# Patient Record
Sex: Male | Born: 1960
Health system: Southern US, Community
[De-identification: ages and names within clinical notes are randomized; demographics above are authoritative.]

## PROBLEM LIST (undated history)

## (undated) DIAGNOSIS — E785 Hyperlipidemia, unspecified: Secondary | ICD-10-CM

## (undated) DIAGNOSIS — D72820 Lymphocytosis (symptomatic): Secondary | ICD-10-CM

## (undated) DIAGNOSIS — F79 Unspecified intellectual disabilities: Secondary | ICD-10-CM

## (undated) DIAGNOSIS — I10 Essential (primary) hypertension: Secondary | ICD-10-CM

## (undated) DIAGNOSIS — N419 Inflammatory disease of prostate, unspecified: Secondary | ICD-10-CM

## (undated) HISTORY — PX: CHOLECYSTECTOMY: SHX55

## (undated) HISTORY — DX: Essential (primary) hypertension: I10

## (undated) HISTORY — DX: Lymphocytosis (symptomatic): D72.820

## (undated) HISTORY — DX: Unspecified intellectual disabilities: F79

## (undated) HISTORY — DX: Other disorders of bilirubin metabolism: E80.6

## (undated) HISTORY — DX: Hyperlipidemia, unspecified: E78.5

## (undated) HISTORY — DX: Inflammatory disease of prostate, unspecified: N41.9

---

## 2009-10-02 ENCOUNTER — Ambulatory Visit: Payer: Self-pay | Admitting: Gastroenterology

## 2009-10-02 LAB — HM COLONOSCOPY: HM COLON: NORMAL

## 2011-05-22 DIAGNOSIS — B351 Tinea unguium: Secondary | ICD-10-CM | POA: Diagnosis not present

## 2011-05-22 DIAGNOSIS — L600 Ingrowing nail: Secondary | ICD-10-CM | POA: Diagnosis not present

## 2011-05-22 DIAGNOSIS — M79609 Pain in unspecified limb: Secondary | ICD-10-CM | POA: Diagnosis not present

## 2011-08-31 DIAGNOSIS — I10 Essential (primary) hypertension: Secondary | ICD-10-CM | POA: Diagnosis not present

## 2012-02-10 DIAGNOSIS — Z23 Encounter for immunization: Secondary | ICD-10-CM | POA: Diagnosis not present

## 2012-08-09 DIAGNOSIS — H6120 Impacted cerumen, unspecified ear: Secondary | ICD-10-CM | POA: Diagnosis not present

## 2012-08-09 DIAGNOSIS — I10 Essential (primary) hypertension: Secondary | ICD-10-CM | POA: Diagnosis not present

## 2013-03-24 DIAGNOSIS — Z23 Encounter for immunization: Secondary | ICD-10-CM | POA: Diagnosis not present

## 2013-03-24 DIAGNOSIS — R059 Cough, unspecified: Secondary | ICD-10-CM | POA: Diagnosis not present

## 2013-03-24 DIAGNOSIS — R05 Cough: Secondary | ICD-10-CM | POA: Diagnosis not present

## 2013-04-27 DIAGNOSIS — Z0000 Encounter for general adult medical examination without abnormal findings: Secondary | ICD-10-CM | POA: Diagnosis not present

## 2013-05-01 DIAGNOSIS — Z0000 Encounter for general adult medical examination without abnormal findings: Secondary | ICD-10-CM | POA: Diagnosis not present

## 2013-05-01 DIAGNOSIS — Z125 Encounter for screening for malignant neoplasm of prostate: Secondary | ICD-10-CM | POA: Diagnosis not present

## 2013-05-01 DIAGNOSIS — F79 Unspecified intellectual disabilities: Secondary | ICD-10-CM | POA: Diagnosis not present

## 2013-05-01 DIAGNOSIS — I10 Essential (primary) hypertension: Secondary | ICD-10-CM | POA: Diagnosis not present

## 2013-05-01 DIAGNOSIS — E785 Hyperlipidemia, unspecified: Secondary | ICD-10-CM | POA: Diagnosis not present

## 2013-06-16 DIAGNOSIS — D72820 Lymphocytosis (symptomatic): Secondary | ICD-10-CM | POA: Diagnosis not present

## 2013-06-20 DIAGNOSIS — D72820 Lymphocytosis (symptomatic): Secondary | ICD-10-CM | POA: Diagnosis not present

## 2013-06-23 DIAGNOSIS — J180 Bronchopneumonia, unspecified organism: Secondary | ICD-10-CM | POA: Diagnosis not present

## 2013-06-23 DIAGNOSIS — R059 Cough, unspecified: Secondary | ICD-10-CM | POA: Diagnosis not present

## 2013-10-27 DIAGNOSIS — R17 Unspecified jaundice: Secondary | ICD-10-CM | POA: Diagnosis not present

## 2013-10-27 DIAGNOSIS — E785 Hyperlipidemia, unspecified: Secondary | ICD-10-CM | POA: Diagnosis not present

## 2013-10-27 DIAGNOSIS — D72820 Lymphocytosis (symptomatic): Secondary | ICD-10-CM | POA: Diagnosis not present

## 2013-10-27 DIAGNOSIS — I10 Essential (primary) hypertension: Secondary | ICD-10-CM | POA: Diagnosis not present

## 2013-12-27 DIAGNOSIS — L578 Other skin changes due to chronic exposure to nonionizing radiation: Secondary | ICD-10-CM | POA: Diagnosis not present

## 2013-12-27 DIAGNOSIS — L408 Other psoriasis: Secondary | ICD-10-CM | POA: Diagnosis not present

## 2013-12-27 DIAGNOSIS — L570 Actinic keratosis: Secondary | ICD-10-CM | POA: Diagnosis not present

## 2014-05-22 DIAGNOSIS — E780 Pure hypercholesterolemia: Secondary | ICD-10-CM | POA: Diagnosis not present

## 2014-05-22 DIAGNOSIS — I10 Essential (primary) hypertension: Secondary | ICD-10-CM | POA: Diagnosis not present

## 2014-07-25 DIAGNOSIS — L400 Psoriasis vulgaris: Secondary | ICD-10-CM | POA: Diagnosis not present

## 2014-07-25 DIAGNOSIS — L578 Other skin changes due to chronic exposure to nonionizing radiation: Secondary | ICD-10-CM | POA: Diagnosis not present

## 2014-07-25 DIAGNOSIS — L570 Actinic keratosis: Secondary | ICD-10-CM | POA: Diagnosis not present

## 2015-01-17 DIAGNOSIS — L400 Psoriasis vulgaris: Secondary | ICD-10-CM | POA: Diagnosis not present

## 2015-01-17 DIAGNOSIS — L578 Other skin changes due to chronic exposure to nonionizing radiation: Secondary | ICD-10-CM | POA: Diagnosis not present

## 2015-01-17 DIAGNOSIS — L570 Actinic keratosis: Secondary | ICD-10-CM | POA: Diagnosis not present

## 2015-04-25 DIAGNOSIS — Z23 Encounter for immunization: Secondary | ICD-10-CM | POA: Diagnosis not present

## 2015-07-25 ENCOUNTER — Telehealth: Payer: Self-pay | Admitting: Family Medicine

## 2015-07-25 DIAGNOSIS — E785 Hyperlipidemia, unspecified: Secondary | ICD-10-CM | POA: Insufficient documentation

## 2015-07-25 DIAGNOSIS — D72820 Lymphocytosis (symptomatic): Secondary | ICD-10-CM | POA: Insufficient documentation

## 2015-07-25 DIAGNOSIS — N419 Inflammatory disease of prostate, unspecified: Secondary | ICD-10-CM | POA: Insufficient documentation

## 2015-07-25 DIAGNOSIS — I10 Essential (primary) hypertension: Secondary | ICD-10-CM | POA: Insufficient documentation

## 2015-07-25 DIAGNOSIS — F79 Unspecified intellectual disabilities: Secondary | ICD-10-CM | POA: Insufficient documentation

## 2015-07-25 MED ORDER — LOSARTAN POTASSIUM-HCTZ 50-12.5 MG PO TABS: 1.0000 | ORAL_TABLET | Freq: Every day | ORAL | Status: DC

## 2015-07-25 NOTE — Telephone Encounter (Signed)
°  we have a pt up front that is a Lada pt last visit was 05/2014 but needs a refill on BP medication, is not following [facility] She[facility],  if I schedule him with Dr. Laural BenesJohnson would she send an RX for pt's BP medication. Pt is completely out of this medication.  Pt needs Losartan. [facility] [address]. Please call pt's brother when this is completed.  Pt scheduled with Dr. Laural BenesJohnson 07/31/15. Thanks.

## 2015-07-25 NOTE — Telephone Encounter (Signed)
Rx from last visit in practice partner sent to his pharmacy

## 2015-07-31 ENCOUNTER — Ambulatory Visit (INDEPENDENT_AMBULATORY_CARE_PROVIDER_SITE_OTHER): Payer: Medicare Other | Admitting: Family Medicine

## 2015-07-31 ENCOUNTER — Encounter: Payer: Self-pay | Admitting: Family Medicine

## 2015-07-31 VITALS — BP 119/74 | HR 69 | Temp 98.0°F | Ht 67.6 in | Wt 177.0 lb

## 2015-07-31 DIAGNOSIS — E785 Hyperlipidemia, unspecified: Secondary | ICD-10-CM

## 2015-07-31 DIAGNOSIS — I10 Essential (primary) hypertension: Secondary | ICD-10-CM

## 2015-07-31 DIAGNOSIS — D72820 Lymphocytosis (symptomatic): Secondary | ICD-10-CM

## 2015-07-31 LAB — MICROALBUMIN, URINE WAIVED
Creatinine, Urine Waived: 100 mg/dL (ref 10–300)
Microalb, Ur Waived: 30 mg/L — ABNORMAL HIGH (ref 0–19)

## 2015-07-31 MED ORDER — LOSARTAN POTASSIUM-HCTZ 50-12.5 MG PO TABS: 1.0000 | ORAL_TABLET | Freq: Every day | ORAL | Status: DC

## 2015-07-31 NOTE — Assessment & Plan Note (Signed)
Rechecking levels again today. Continue to work on diet. Continue to monitor. Call with any concerns.

## 2015-07-31 NOTE — Assessment & Plan Note (Signed)
Rechecking levels today. Await results. Call with any concerns.  

## 2015-07-31 NOTE — Assessment & Plan Note (Signed)
Rechecking CBC today. Await results.  

## 2015-07-31 NOTE — Assessment & Plan Note (Signed)
Better on recheck. Continue current regimen. Continue to monitor. Call with any concerns.  

## 2015-07-31 NOTE — Progress Notes (Signed)
BP 119/74 mmHg  Pulse 69  Temp(Src) 98 F (36.7 C)  Ht 5' 7.6" (1.717 m)  Wt 177 lb (80.287 kg)  BMI 27.23 kg/m2  SpO2 100%   Subjective:    Patient ID: Bryan West, male    DOB: 01/06/1961, 55 y.o.   MRN: [id number]  HPI: Bryan West is a 55 y.o. male  Chief Complaint  Patient presents with  . Hypertension    Patient's brother would like a 90day supply of his blood pressure medication   HYPERTENSION / HYPERLIPIDEMIA Satisfied with current treatment? yes Duration of hypertension: chronic BP monitoring frequency: not checking BP medication side effects: no Past BP meds: losartan-hctz Duration of hyperlipidemia: chronic Cholesterol medication side effects: not on anything Cholesterol supplements: none Past cholesterol medications: none Medication compliance: excellent compliance Aspirin: no Recent stressors: no Recurrent headaches: no Visual changes: no Palpitations: no Dyspnea: no Chest pain: no Lower extremity edema: no Dizzy/lightheaded: no  Relevant past medical, surgical, family and social history reviewed and updated as indicated. Interim medical history since our last visit reviewed. Allergies and medications reviewed and updated.  Review of Systems  Constitutional: Negative.   Respiratory: Negative.   Cardiovascular: Negative.   Gastrointestinal: Negative.   Psychiatric/Behavioral: Negative.     Per HPI unless specifically indicated above     Objective:    BP 119/74 mmHg  Pulse 69  Temp(Src) 98 F (36.7 C)  Ht 5' 7.6" (1.717 m)  Wt 177 lb (80.287 kg)  BMI 27.23 kg/m2  SpO2 100%  Wt Readings from Last 3 Encounters:  07/31/15 177 lb (80.287 kg)  05/22/14 190 lb (86.183 kg)    Physical Exam  Constitutional: He is oriented to person, place, and time. He appears well-developed and well-nourished. No distress.  HENT:  Head: Normocephalic and atraumatic.  Right Ear: Hearing normal.  Left Ear: Hearing normal.  Nose: Nose normal.  Eyes:  Conjunctivae and lids are normal. Right eye exhibits no discharge. Left eye exhibits no discharge. No scleral icterus.  Cardiovascular: Normal rate, regular rhythm, normal heart sounds and intact distal pulses.  Exam reveals no gallop and no friction rub.   No murmur heard. Pulmonary/Chest: Effort normal and breath sounds normal. No respiratory distress. He has no wheezes. He has no rales. He exhibits no tenderness.  Musculoskeletal: Normal range of motion.  Neurological: He is alert and oriented to person, place, and time.  Skin: Skin is warm, dry and intact. No rash noted. He is not diaphoretic. No erythema. No pallor.  Psychiatric: He has a normal mood and affect. His speech is normal and behavior is normal. Judgment and thought content normal. Cognition and memory are normal.  Nursing note and vitals reviewed.   Results for orders placed or performed in visit on 07/25/15  HM COLONOSCOPY  Result Value Ref Range   HM Colonoscopy Patient Reported Normal See Report, Patient Reported Normal      Assessment & Plan:   Problem List Items Addressed This Visit      Cardiovascular and Mediastinum   Hypertension - Primary    Better on recheck. Continue current regimen. Continue to monitor. Call with any concerns.       Relevant Medications   losartan-hydrochlorothiazide (HYZAAR) 50-12.5 MG tablet   Other Relevant Orders   Comprehensive metabolic panel   Microalbumin, Urine Waived   TSH     Other   Hyperlipidemia    Rechecking levels again today. Continue to work on diet. Continue to monitor. Call with  any concerns.       Relevant Medications   losartan-hydrochlorothiazide (HYZAAR) 50-12.5 MG tablet   Other Relevant Orders   Comprehensive metabolic panel   Lipid Panel w/o Chol/HDL Ratio   Lymphocytosis    Rechecking CBC today. Await results.       Relevant Orders   CBC with Differential/Platelet   Hyperbilirubinemia    Rechecking levels today. Await results. Call with any  concerns.       Relevant Orders   Comprehensive metabolic panel       Follow up plan: Return in about 6 months (around 01/30/2016) for Physical.

## 2015-08-01 ENCOUNTER — Encounter: Payer: Self-pay | Admitting: Family Medicine

## 2015-08-01 LAB — COMPREHENSIVE METABOLIC PANEL
ALT: 16 IU/L (ref 0–44)
AST: 7 IU/L (ref 0–40)
Albumin/Globulin Ratio: 1.4 (ref 1.2–2.2)
Albumin: 4.1 g/dL (ref 3.5–5.5)
Alkaline Phosphatase: 83 IU/L (ref 39–117)
BILIRUBIN TOTAL: 0.9 mg/dL (ref 0.0–1.2)
BUN/Creatinine Ratio: 9 (ref 9–20)
BUN: 9 mg/dL (ref 6–24)
CALCIUM: 9.2 mg/dL (ref 8.7–10.2)
CHLORIDE: 96 mmol/L (ref 96–106)
CO2: 27 mmol/L (ref 18–29)
Creatinine, Ser: 0.96 mg/dL (ref 0.76–1.27)
GFR calc non Af Amer: 88 mL/min/{1.73_m2} (ref >59)
GFR, EST AFRICAN AMERICAN: 102 mL/min/{1.73_m2} (ref >59)
GLUCOSE: 125 mg/dL — AB (ref 65–99)
Globulin, Total: 2.9 g/dL (ref 1.5–4.5)
Potassium: 3.4 mmol/L — ABNORMAL LOW (ref 3.5–5.2)
Sodium: 140 mmol/L (ref 134–144)
TOTAL PROTEIN: 7 g/dL (ref 6.0–8.5)

## 2015-08-01 LAB — CBC WITH DIFFERENTIAL/PLATELET
BASOS ABS: 0 10*3/uL (ref 0.0–0.2)
Basos: 1 %
EOS (ABSOLUTE): 0.1 10*3/uL (ref 0.0–0.4)
Eos: 2 %
Hematocrit: 43.4 % (ref 37.5–51.0)
Hemoglobin: 15.5 g/dL (ref 12.6–17.7)
IMMATURE GRANULOCYTES: 0 %
Immature Grans (Abs): 0 10*3/uL (ref 0.0–0.1)
Lymphocytes Absolute: 2.2 10*3/uL (ref 0.7–3.1)
Lymphs: 36 %
MCH: 32 pg (ref 26.6–33.0)
MCHC: 35.7 g/dL (ref 31.5–35.7)
MCV: 90 fL (ref 79–97)
Monocytes Absolute: 0.5 10*3/uL (ref 0.1–0.9)
Monocytes: 9 %
NEUTROS PCT: 52 %
Neutrophils Absolute: 3.3 10*3/uL (ref 1.4–7.0)
PLATELETS: 201 10*3/uL (ref 150–379)
RBC: 4.85 x10E6/uL (ref 4.14–5.80)
RDW: 13.2 % (ref 12.3–15.4)
WBC: 6.3 10*3/uL (ref 3.4–10.8)

## 2015-08-01 LAB — LIPID PANEL W/O CHOL/HDL RATIO
Cholesterol, Total: 200 mg/dL — ABNORMAL HIGH (ref 100–199)
HDL: 40 mg/dL (ref >39)
LDL Calculated: 123 mg/dL — ABNORMAL HIGH (ref 0–99)
Triglycerides: 187 mg/dL — ABNORMAL HIGH (ref 0–149)
VLDL CHOLESTEROL CAL: 37 mg/dL (ref 5–40)

## 2015-08-01 LAB — TSH: TSH: 1.56 u[IU]/mL (ref 0.450–4.500)

## 2015-08-07 DIAGNOSIS — H5015 Alternating exotropia: Secondary | ICD-10-CM | POA: Diagnosis not present

## 2016-01-07 ENCOUNTER — Encounter (INDEPENDENT_AMBULATORY_CARE_PROVIDER_SITE_OTHER): Payer: Self-pay

## 2016-01-20 DIAGNOSIS — L400 Psoriasis vulgaris: Secondary | ICD-10-CM | POA: Diagnosis not present

## 2016-01-20 DIAGNOSIS — L578 Other skin changes due to chronic exposure to nonionizing radiation: Secondary | ICD-10-CM | POA: Diagnosis not present

## 2016-01-20 DIAGNOSIS — L570 Actinic keratosis: Secondary | ICD-10-CM | POA: Diagnosis not present

## 2016-01-20 DIAGNOSIS — L820 Inflamed seborrheic keratosis: Secondary | ICD-10-CM | POA: Diagnosis not present

## 2016-01-20 DIAGNOSIS — L821 Other seborrheic keratosis: Secondary | ICD-10-CM | POA: Diagnosis not present

## 2016-01-31 ENCOUNTER — Encounter: Payer: Self-pay | Admitting: Family Medicine

## 2016-01-31 ENCOUNTER — Ambulatory Visit (INDEPENDENT_AMBULATORY_CARE_PROVIDER_SITE_OTHER): Payer: Medicare Other | Admitting: Family Medicine

## 2016-01-31 VITALS — BP 124/74 | HR 56 | Temp 97.6°F | Ht 67.6 in | Wt 178.0 lb

## 2016-01-31 DIAGNOSIS — D72820 Lymphocytosis (symptomatic): Secondary | ICD-10-CM

## 2016-01-31 DIAGNOSIS — E782 Mixed hyperlipidemia: Secondary | ICD-10-CM

## 2016-01-31 DIAGNOSIS — F79 Unspecified intellectual disabilities: Secondary | ICD-10-CM | POA: Diagnosis not present

## 2016-01-31 DIAGNOSIS — Z0001 Encounter for general adult medical examination with abnormal findings: Secondary | ICD-10-CM

## 2016-01-31 DIAGNOSIS — R5383 Other fatigue: Secondary | ICD-10-CM

## 2016-01-31 DIAGNOSIS — Z0000 Encounter for general adult medical examination without abnormal findings: Secondary | ICD-10-CM

## 2016-01-31 DIAGNOSIS — I10 Essential (primary) hypertension: Secondary | ICD-10-CM

## 2016-01-31 DIAGNOSIS — H6123 Impacted cerumen, bilateral: Secondary | ICD-10-CM

## 2016-01-31 DIAGNOSIS — Z23 Encounter for immunization: Secondary | ICD-10-CM | POA: Diagnosis not present

## 2016-01-31 DIAGNOSIS — R3911 Hesitancy of micturition: Secondary | ICD-10-CM | POA: Diagnosis not present

## 2016-01-31 LAB — UA/M W/RFLX CULTURE, ROUTINE
Bilirubin, UA: NEGATIVE
Glucose, UA: NEGATIVE
Ketones, UA: NEGATIVE
Nitrite, UA: NEGATIVE
Protein, UA: NEGATIVE
RBC, UA: NEGATIVE
Specific Gravity, UA: 1.025 (ref 1.005–1.030)
Urobilinogen, Ur: 0.2 mg/dL (ref 0.2–1.0)
pH, UA: 5 (ref 5.0–7.5)

## 2016-01-31 LAB — MICROSCOPIC EXAMINATION
Bacteria, UA: NONE SEEN
Epithelial Cells (non renal): NONE SEEN /HPF
RBC, UA: NONE SEEN /HPF

## 2016-01-31 LAB — MICROALBUMIN, URINE WAIVED
Creatinine, Urine Waived: 300 mg/dL (ref 10–300)
Microalb, Ur Waived: 30 mg/L — ABNORMAL HIGH (ref 0–19)
Microalb/Creat Ratio: <30 mg/g

## 2016-01-31 MED ORDER — LOSARTAN POTASSIUM-HCTZ 50-12.5 MG PO TABS: 1.0000 | ORAL_TABLET | Freq: Every day | ORAL | 1 refills | Status: DC

## 2016-01-31 NOTE — Assessment & Plan Note (Signed)
Rechecking levels today. Continue to monitor. Call with any concerns.  

## 2016-01-31 NOTE — Assessment & Plan Note (Signed)
Better on recheck. Continue current regimen. Continue to monitor. Call with any concerns.  

## 2016-01-31 NOTE — Progress Notes (Signed)
BP 124/74   Pulse (!) 56   Temp 97.6 F (36.4 C)   Ht 5' 7.6" (1.717 m)   Wt 178 lb (80.7 kg)   SpO2 100%   BMI 27.39 kg/m    Subjective:    Patient ID: Bryan West C Bearce, male    DOB: 11/04/1960, 55 y.o.   MRN: [id number]  HPI: Bryan PicklesDanny C Dabbs is a 55 y.o. male presenting on 01/31/2016 for comprehensive medical examination. Current medical complaints include:  HYPERTENSION / HYPERLIPIDEMIA Satisfied with current treatment? yes Duration of hypertension: chronic BP monitoring frequency: not checking BP medication side effects: no Duration of hyperlipidemia: chronic Cholesterol medication side effects: not on anything Cholesterol supplements: none Past cholesterol medications: none Medication compliance: excellent compliance Aspirin: no Recent stressors: no Recurrent headaches: no Visual changes: no Palpitations: no Dyspnea: no Chest pain: no Lower extremity edema: no Dizzy/lightheaded: no   He currently lives with: brother Interim Problems from his last visit: no  Depression Screen done today and results listed below:  [facility]/2016  Decreased Interest 0  Down, Depressed, Hopeless 0  PHQ - 2 Score 0     Past Medical History:  Past Medical History:  Diagnosis Date  . Hyperbilirubinemia   . Hyperlipidemia   . Hypertension   . Lymphocytosis   . MR (mental retardation)   . Prostatitis     Surgical History:  Past Surgical History:  Procedure Laterality Date  . CHOLECYSTECTOMY      Medications:  No current outpatient prescriptions on file prior to visit.   No current facility-administered medications on file prior to visit.     Allergies:  No Known Allergies  Social History:  Social History   Social History  . Marital status: Single    Spouse name: N/A  . Number of children: N/A  . Years of education: N/A   Occupational History  . Not on file.   Social History Main Topics  . Smoking status: Never Smoker  . Smokeless  tobacco: Never Used  . Alcohol use No  . Drug use: No  . Sexual activity: Not on file   Other Topics Concern  . Not on file   Social History Narrative  . No narrative on file   History  Smoking Status  . Never Smoker  Smokeless Tobacco  . Never Used   History  Alcohol Use No    Family History:  Family History  Problem Relation Age of Onset  . Pulmonary fibrosis Mother   . Hypertension Father   . Cancer Father     prostate  . Cancer Brother     skin  . Hypertension Brother     Past medical history, surgical history, medications, allergies, family history and social history reviewed with patient today and changes made to appropriate areas of the chart.   Review of Systems  Constitutional: Negative.   HENT: Negative.   Eyes: Negative.   Respiratory: Negative.   Cardiovascular: Negative.   Gastrointestinal: Negative.   Genitourinary: Negative.   Musculoskeletal: Negative.   Skin: Negative.   Neurological: Negative.   Endo/Heme/Allergies: Negative.   Psychiatric/Behavioral: Negative.     All other ROS negative except what is listed above and in the HPI.      Objective:    BP 124/74   Pulse (!) 56   Temp 97.6 F (36.4 C)   Ht 5' 7.6" (1.717 m)   Wt 178 lb (80.7 kg)   SpO2 100%   BMI  27.39 kg/m   Wt Readings from Last 3 Encounters:  01/31/16 178 lb (80.7 kg)  07/31/15 177 lb (80.3 kg)  05/22/14 190 lb (86.2 kg)    Physical Exam  Constitutional: He is oriented to person, place, and time. He appears well-developed and well-nourished. No distress.  HENT:  Head: Normocephalic and atraumatic.  Right Ear: Hearing normal.  Left Ear: Hearing normal.  Nose: Nose normal.  Mouth/Throat: Oropharynx is clear and moist. No oropharyngeal exudate.  Cerumen impaction bilaterally  Eyes: Conjunctivae, EOM and lids are normal. Pupils are equal, round, and reactive to light. Right eye exhibits no discharge. Left eye exhibits no discharge. No scleral icterus.  Neck:  Normal range of motion. Neck supple. No JVD present. No tracheal deviation present. No thyromegaly present.  Cardiovascular: Normal rate, regular rhythm, normal heart sounds and intact distal pulses.  Exam reveals no gallop and no friction rub.   No murmur heard. Pulmonary/Chest: Effort normal. No stridor. No respiratory distress. He has no wheezes. He has no rales. He exhibits no tenderness.  Abdominal: Soft. Bowel sounds are normal. He exhibits no distension and no mass. There is no tenderness. There is no rebound and no guarding.  Genitourinary:  Genitourinary Comments: Deferred at guardian's request  Musculoskeletal: Normal range of motion. He exhibits no edema, tenderness or deformity.  Lymphadenopathy:    He has no cervical adenopathy.  Neurological: He is alert and oriented to person, place, and time. He has normal reflexes. He displays normal reflexes. No cranial nerve deficit. He exhibits normal muscle tone. Coordination normal.  Skin: Skin is warm, dry and intact. No rash noted. He is not diaphoretic. No erythema. No pallor.  Psychiatric: He has a normal mood and affect. His speech is normal and behavior is normal. Judgment and thought content normal. Cognition and memory are normal.  Nursing note and vitals reviewed.   Results for orders placed or performed in visit on 07/31/15  CBC with Differential/Platelet  Result Value Ref Range   WBC 6.3 3.4 - 10.8 x10E3/uL   RBC 4.85 4.14 - 5.80 x10E6/uL   Hemoglobin 15.5 12.6 - 17.7 g/dL   Hematocrit 16.1 09.6 - 51.0 %   MCV 90 79 - 97 fL   MCH 32.0 26.6 - 33.0 pg   MCHC 35.7 31.5 - 35.7 g/dL   RDW 04.5 40.9 - 81.1 %   Platelets 201 150 - 379 x10E3/uL   Neutrophils 52 %   Lymphs 36 %   Monocytes 9 %   Eos 2 %   Basos 1 %   Neutrophils Absolute 3.3 1.4 - 7.0 x10E3/uL   Lymphocytes Absolute 2.2 0.7 - 3.1 x10E3/uL   Monocytes Absolute 0.5 0.1 - 0.9 x10E3/uL   EOS (ABSOLUTE) 0.1 0.0 - 0.4 x10E3/uL   Basophils Absolute 0.0 0.0 - 0.2  x10E3/uL   Immature Granulocytes 0 %   Immature Grans (Abs) 0.0 0.0 - 0.1 x10E3/uL  Comprehensive metabolic panel  Result Value Ref Range   Glucose 125 (H) 65 - 99 mg/dL   BUN 9 6 - 24 mg/dL   Creatinine, Ser 9.14 0.76 - 1.27 mg/dL   GFR calc non Af Amer 88 >59 mL/min/1.73   GFR calc Af Amer 102 >59 mL/min/1.73   BUN/Creatinine Ratio 9 9 - 20   Sodium 140 134 - 144 mmol/L   Potassium 3.4 (L) 3.5 - 5.2 mmol/L   Chloride 96 96 - 106 mmol/L   CO2 27 18 - 29 mmol/L   Calcium 9.2 8.7 -  10.2 mg/dL   Total Protein 7.0 6.0 - 8.5 g/dL   Albumin 4.1 3.5 - 5.5 g/dL   Globulin, Total 2.9 1.5 - 4.5 g/dL   Albumin/Globulin Ratio 1.4 1.2 - 2.2   Bilirubin Total 0.9 0.0 - 1.2 mg/dL   Alkaline Phosphatase 83 39 - 117 IU/L   AST 7 0 - 40 IU/L   ALT 16 0 - 44 IU/L  Lipid Panel w/o Chol/HDL Ratio  Result Value Ref Range   Cholesterol, Total 200 (H) 100 - 199 mg/dL   Triglycerides 161 (H) 0 - 149 mg/dL   HDL 40 >09 mg/dL   VLDL Cholesterol Cal 37 5 - 40 mg/dL   LDL Calculated 604 (H) 0 - 99 mg/dL  Microalbumin, Urine Waived  Result Value Ref Range   Microalb, Ur Waived 30 (H) 0 - 19 mg/L   Creatinine, Urine Waived 100 10 - 300 mg/dL   Microalb/Creat Ratio <30 <30 mg/g  TSH  Result Value Ref Range   TSH 1.560 0.450 - 4.500 uIU/mL      Assessment & Plan:   Problem List Items Addressed This Visit      Cardiovascular and Mediastinum   Hypertension    Better on recheck. Continue current regimen. Continue to monitor. Call with any concerns.      Relevant Medications   losartan-hydrochlorothiazide (HYZAAR) 50-12.5 MG tablet   Other Relevant Orders   Comprehensive metabolic panel   Microalbumin, Urine Waived   UA/M w/rflx Culture, Routine     Other   Hyperlipidemia    Rechecking levels today. Continue to monitor. Call with any concerns.       Relevant Medications   losartan-hydrochlorothiazide (HYZAAR) 50-12.5 MG tablet   Other Relevant Orders   Comprehensive metabolic panel    Lipid Panel w/o Chol/HDL Ratio   UA/M w/rflx Culture, Routine   MR (mental retardation)    Living with his brother. Doing well. No concerns.       Lymphocytosis    Rechecking levels today. Continue to monitor. Call with any concerns.       Relevant Orders   CBC with Differential/Platelet   Hyperbilirubinemia    Rechecking levels today. Continue to monitor. Call with any concerns.       Relevant Orders   Comprehensive metabolic panel   UA/M w/rflx Culture, Routine    Other Visit Diagnoses    Routine general medical examination at a health care facility    -  Primary   up to date on vaccines. Screening labs checked. Continue diet and exercise. Call with any concerns.    Relevant Orders   CBC with Differential/Platelet   Comprehensive metabolic panel   Lipid Panel w/o Chol/HDL Ratio   Microalbumin, Urine Waived   PSA   TSH   UA/M w/rflx Culture, Routine   Immunization due       Flu shot given today.   Relevant Orders   Flu Vaccine QUAD 36+ mos PF IM (Fluarix & Fluzone Quad PF) (Completed)   Urinary hesitancy       Will check PSA. Await results   Relevant Orders   PSA   Fatigue, unspecified type       Will check labs. Await results.    Relevant Orders   TSH   Hearing loss due to cerumen impaction, bilateral       Ears flushed today. Some dry cerumen deep. Will start debrox and return in 1-2 weeks to get the rest out.  Discussed aspirin prophylaxis for myocardial infarction prevention and decision was it was not indicated  LABORATORY TESTING:  Health maintenance labs ordered today as discussed above.   The natural history of prostate cancer and ongoing controversy regarding screening and potential treatment outcomes of prostate cancer has been discussed with the patient. The meaning of a false positive PSA and a false negative PSA has been discussed. He indicates understanding of the limitations of this screening test and wishes to proceed with screening PSA  testing.   IMMUNIZATIONS:   - Tdap: Tetanus vaccination status reviewed: last tetanus booster within 10 years. - Influenza: Administered today  SCREENING: - Colonoscopy: Up to date  Discussed with patient purpose of the colonoscopy is to detect colon cancer at curable precancerous or early stages   PATIENT COUNSELING:    Sexuality: Discussed sexually transmitted diseases, partner selection, use of condoms, avoidance of unintended pregnancy  and contraceptive alternatives.   Advised to avoid cigarette smoking.  I discussed with the patient that most people either abstain from alcohol or drink within safe limits (<=14/week and <=4 drinks/occasion for males, <=7/weeks and <= 3 drinks/occasion for females) and that the risk for alcohol disorders and other health effects rises proportionally with the number of drinks per week and how often a drinker exceeds daily limits.  Discussed cessation/primary prevention of drug use and availability of treatment for abuse.   Diet: Encouraged to adjust caloric intake to maintain  or achieve ideal body weight, to reduce intake of dietary saturated fat and total fat, to limit sodium intake by avoiding high sodium foods and not adding table salt, and to maintain adequate dietary potassium and calcium preferably from fresh fruits, vegetables, and low-fat dairy products.    stressed the importance of regular exercise  Injury prevention: Discussed safety belts, safety helmets, smoke detector, smoking near bedding or upholstery.   Dental health: Discussed importance of regular tooth brushing, flossing, and dental visits.   Follow up plan: NEXT PREVENTATIVE PHYSICAL DUE IN 1 YEAR. Return 1-2 weeks for ear flush, 6 months for follow up on BP.

## 2016-01-31 NOTE — Assessment & Plan Note (Signed)
Living with his brother. Doing well. No concerns.

## 2016-01-31 NOTE — Patient Instructions (Addendum)
DEBROX- 10 minutes in each ear 1x a day for next 3-4 days  Influenza (Flu) Vaccine (Inactivated or Recombinant):  1. Why get vaccinated? Influenza ("flu") is a contagious disease that spreads around the [place] every year, usually between October and May. Flu is caused by influenza viruses, and is spread mainly by coughing, sneezing, and close contact. Anyone can get flu. Flu strikes suddenly and can last several days. Symptoms vary by age, but can include:  fever/chills  sore throat  muscle aches  fatigue  cough  headache  runny or stuffy nose Flu can also lead to pneumonia and blood infections, and cause diarrhea and seizures in children. If you have a medical condition, such as heart or lung disease, flu can make it worse. Flu is more dangerous for some people. Infants and young children, people 54 years of age and older, pregnant women, and people with certain health conditions or a weakened immune system are at greatest risk. Each year thousands of people in the [place] die from flu, and many more are hospitalized. Flu vaccine can:  keep you from getting flu,  make flu less severe if you do get it, and  keep you from spreading flu to your family and other people. 2. Inactivated and recombinant flu vaccines A dose of flu vaccine is recommended every flu season. Children 6 months through 24 years of age may need two doses during the same flu season. Everyone else needs only one dose each flu season. Some inactivated flu vaccines contain a very small amount of a mercury-based preservative called thimerosal. Studies have not shown thimerosal in vaccines to be harmful, but flu vaccines that do not contain thimerosal are available. There is no live flu virus in flu shots. They cannot cause the flu. There are many flu viruses, and they are always changing. Each year a new flu vaccine is made to protect against three or four viruses that are likely to cause disease in the  upcoming flu season. But even when the vaccine doesn't exactly match these viruses, it may still provide some protection. Flu vaccine cannot prevent:  flu that is caused by a virus not covered by the vaccine, or  illnesses that look like flu but are not. It takes about 2 weeks for protection to develop after vaccination, and protection lasts through the flu season. 3. Some people should not get this vaccine Tell the person who is giving you the vaccine:  If you have any severe, life-threatening allergies. If you ever had a life-threatening allergic reaction after a dose of flu vaccine, or have a severe allergy to any part of this vaccine, you may be advised not to get vaccinated. Most, but not all, types of flu vaccine contain a small amount of egg protein.  If you ever had Guillain-Barre Syndrome (also called GBS). Some people with a history of GBS should not get this vaccine. This should be discussed with your doctor.  If you are not feeling well. It is usually okay to get flu vaccine when you have a mild illness, but you might be asked to come back when you feel better. 4. Risks of a vaccine reaction With any medicine, including vaccines, there is a chance of reactions. These are usually mild and go away on their own, but serious reactions are also possible. Most people who get a flu shot do not have any problems with it. Minor problems following a flu shot include:  soreness, redness, or swelling where the  shot was given  hoarseness  sore, red or itchy eyes  cough  fever  aches  headache  itching  fatigue If these problems occur, they usually begin soon after the shot and last 1 or 2 days. More serious problems following a flu shot can include the following:  There may be a small increased risk of Guillain-Barre Syndrome (GBS) after inactivated flu vaccine. This risk has been estimated at 1 or 2 additional cases per million people vaccinated. This is much lower than the  risk of severe complications from flu, which can be prevented by flu vaccine.  Young children who get the flu shot along with pneumococcal vaccine (PCV13) and/or DTaP vaccine at the same time might be slightly more likely to have a seizure caused by fever. Ask your doctor for more information. Tell your doctor if a child who is getting flu vaccine has ever had a seizure. Problems that could happen after any injected vaccine:  People sometimes faint after a medical procedure, including vaccination. Sitting or lying down for about 15 minutes can help prevent fainting, and injuries caused by a fall. Tell your doctor if you feel dizzy, or have vision changes or ringing in the ears.  Some people get severe pain in the shoulder and have difficulty moving the arm where a shot was given. This happens very rarely.  Any medication can cause a severe allergic reaction. Such reactions from a vaccine are very rare, estimated at about 1 in a million doses, and would happen within a few minutes to a few hours after the vaccination. As with any medicine, there is a very remote chance of a vaccine causing a serious injury or death. The safety of vaccines is always being monitored. For more information, visit: http://floyd.org/ 5. What if there is a serious reaction? What should I look for?  Look for anything that concerns you, such as signs of a severe allergic reaction, very high fever, or unusual behavior. Signs of a severe allergic reaction can include hives, swelling of the face and throat, difficulty breathing, a fast heartbeat, dizziness, and weakness. These would start a few minutes to a few hours after the vaccination. What should I do?  If you think it is a severe allergic reaction or other emergency that can't wait, call 9-1-1 and get the person to the nearest hospital. Otherwise, call your doctor.  Reactions should be reported to the Vaccine Adverse Event Reporting System (VAERS). Your doctor  should file this report, or you can do it yourself through the VAERS web site at www.vaers.LAgents.no, or by calling 1-(713)389-2565. VAERS does not give medical advice. 6. The National Vaccine Injury Compensation Program The [employer] Vaccine Injury Compensation Program (VICP) is a federal program that was created to compensate people who may have been injured by certain vaccines. Persons who believe they may have been injured by a vaccine can learn about the program and about filing a claim by calling 1-406-360-1359 or visiting the VICP website at SpiritualWord.at. There is a time limit to file a claim for compensation. 7. How can I learn more?  Ask your healthcare provider. He or she can give you the vaccine package insert or suggest other sources of information.  Call your local or state health department.  Contact the Centers for Disease Control and Prevention (CDC):  Call 332-624-3746 (1-800-CDC-INFO) or  Visit CDC's website at BiotechRoom.com.cy Vaccine Information Statement Inactivated Influenza Vaccine (11/17/2013)   This information is not intended to replace advice given to you  by your health care provider. Make sure you discuss any questions you have with your health care provider.   Document Released: 01/22/2006 Document Revised: 04/20/2014 Document Reviewed: 11/20/2013 Elsevier Interactive Patient Education 2016 [employer] Health Maintenance, Male A healthy lifestyle and preventative care can promote health and wellness.  Maintain regular health, dental, and eye exams.  Eat a healthy diet. Foods like vegetables, fruits, whole grains, low-fat dairy products, and lean protein foods contain the nutrients you need and are low in calories. Decrease your intake of foods high in solid fats, added sugars, and salt. Get information about a proper diet from your health care provider, if necessary.  Regular physical exercise is one of the most important things you can do  for your health. Most adults should get at least 150 minutes of moderate-intensity exercise (any activity that increases your heart rate and causes you to sweat) each week. In addition, most adults need muscle-strengthening exercises on 2 or more days a week.   Maintain a healthy weight. The body mass index (BMI) is a screening tool to identify possible weight problems. It provides an estimate of body fat based on height and weight. Your health care provider can find your BMI and can help you achieve or maintain a healthy weight. For males 20 years and older:  A BMI below 18.5 is considered underweight.  A BMI of 18.5 to 24.9 is normal.  A BMI of 25 to 29.9 is considered overweight.  A BMI of 30 and above is considered obese.  Maintain normal blood lipids and cholesterol by exercising and minimizing your intake of saturated fat. Eat a balanced diet with plenty of fruits and vegetables. Blood tests for lipids and cholesterol should begin at age 56 and be repeated every 5 years. If your lipid or cholesterol levels are high, you are over age 250, or you are at high risk for heart disease, you may need your cholesterol levels checked more frequently.Ongoing high lipid and cholesterol levels should be treated with medicines if diet and exercise are not working.  If you smoke, find out from your health care provider how to quit. If you do not use tobacco, do not start.  Lung cancer screening is recommended for adults aged 55-80 years who are at high risk for developing lung cancer because of a history of smoking. A yearly low-dose CT scan of the lungs is recommended for people who have at least a 30-pack-year history of smoking and are current smokers or have quit within the past 15 years. A pack year of smoking is smoking an average of 1 pack of cigarettes a day for 1 year (for example, a 30-pack-year history of smoking could mean smoking 1 pack a day for 30 years or 2 packs a day for 15 years). Yearly  screening should continue until the smoker has stopped smoking for at least 15 years. Yearly screening should be stopped for people who develop a health problem that would prevent them from having lung cancer treatment.  If you choose to drink alcohol, do not have more than 2 drinks per day. One drink is considered to be 12 oz (360 mL) of beer, 5 oz (150 mL) of wine, or 1.5 oz (45 mL) of liquor.  Avoid the use of street drugs. Do not share needles with anyone. Ask for help if you need support or instructions about stopping the use of drugs.  High blood pressure causes heart disease and increases the risk of stroke. High blood  pressure is more likely to develop in:  People who have blood pressure in the end of the normal range (100-139/85-89 mm Hg).  People who are overweight or obese.  People who are African American.  If you are 53-44 years of age, have your blood pressure checked every 3-5 years. If you are 62 years of age or older, have your blood pressure checked every year. You should have your blood pressure measured twice--once when you are at a hospital or clinic, and once when you are not at a hospital or clinic. Record the average of the two measurements. To check your blood pressure when you are not at a hospital or clinic, you can use:  An automated blood pressure machine at a pharmacy.  A home blood pressure monitor.  If you are 94-60 years old, ask your health care provider if you should take aspirin to prevent heart disease.  Diabetes screening involves taking a blood sample to check your fasting blood sugar level. This should be done once every 3 years after age 104 if you are at a normal weight and without risk factors for diabetes. Testing should be considered at a younger age or be carried out more frequently if you are overweight and have at least 1 risk factor for diabetes.  Colorectal cancer can be detected and often prevented. Most routine colorectal cancer screening  begins at the age of 14 and continues through age 39. However, your health care provider may recommend screening at an earlier age if you have risk factors for colon cancer. On a yearly basis, your health care provider may provide home test kits to check for hidden blood in the stool. A small camera at the end of a tube may be used to directly examine the colon (sigmoidoscopy or colonoscopy) to detect the earliest forms of colorectal cancer. Talk to your health care provider about this at age 78 when routine screening begins. A direct exam of the colon should be repeated every 5-10 years through age 16, unless early forms of precancerous polyps or small growths are found.  People who are at an increased risk for hepatitis B should be screened for this virus. You are considered at high risk for hepatitis B if:  You were born in a country where hepatitis B occurs often. Talk with your health care provider about which countries are considered high risk.  Your parents were born in a high-risk country and you have not received a shot to protect against hepatitis B (hepatitis B vaccine).  You have HIV or AIDS.  You use needles to inject street drugs.  You live with, or have sex with, someone who has hepatitis B.  You are a man who has sex with other men (MSM).  You get hemodialysis treatment.  You take certain medicines for conditions like cancer, organ transplantation, and autoimmune conditions.  Hepatitis C blood testing is recommended for all people born from [birth year] through 1966 and any individual with known risk factors for hepatitis C.  Healthy men should no longer receive prostate-specific antigen (PSA) blood tests as part of routine cancer screening. Talk to your health care provider about prostate cancer screening.  Testicular cancer screening is not recommended for adolescents or adult males who have no symptoms. Screening includes self-exam, a health care provider exam, and other screening  tests. Consult with your health care provider about any symptoms you have or any concerns you have about testicular cancer.  Practice safe sex. Use condoms and avoid  high-risk sexual practices to reduce the spread of sexually transmitted infections (STIs).  You should be screened for STIs, including gonorrhea and chlamydia if:  You are sexually active and are younger than 24 years.  You are older than 24 years, and your health care provider tells you that you are at risk for this type of infection.  Your sexual activity has changed since you were last screened, and you are at an increased risk for chlamydia or gonorrhea. Ask your health care provider if you are at risk.  If you are at risk of being infected with HIV, it is recommended that you take a prescription medicine daily to prevent HIV infection. This is called pre-exposure prophylaxis (PrEP). You are considered at risk if:  You are a man who has sex with other men (MSM).  You are a heterosexual man who is sexually active with multiple partners.  You take drugs by injection.  You are sexually active with a partner who has HIV.  Talk with your health care provider about whether you are at high risk of being infected with HIV. If you choose to begin PrEP, you should first be tested for HIV. You should then be tested every 3 months for as long as you are taking PrEP.  Use sunscreen. Apply sunscreen liberally and repeatedly throughout the day. You should seek shade when your shadow is shorter than you. Protect yourself by wearing long sleeves, pants, a wide-brimmed hat, and sunglasses year round whenever you are outdoors.  Tell your health care provider of new moles or changes in moles, especially if there is a change in shape or color. Also, tell your health care provider if a mole is larger than the size of a pencil eraser.  A one-time screening for abdominal aortic aneurysm (AAA) and surgical repair of large AAAs by ultrasound is  recommended for men aged 65-75 years who are current or former smokers.  Stay current with your vaccines (immunizations).   This information is not intended to replace advice given to you by your health care provider. Make sure you discuss any questions you have with your health care provider.   Document Released: 09/26/2007 Document Revised: 04/20/2014 Document Reviewed: 08/25/2010 Elsevier Interactive Patient Education [employer]

## 2016-02-01 LAB — LIPID PANEL W/O CHOL/HDL RATIO
CHOLESTEROL TOTAL: 213 mg/dL — AB (ref 100–199)
HDL: 43 mg/dL (ref >39)
LDL CALC: 122 mg/dL — AB (ref 0–99)
TRIGLYCERIDES: 241 mg/dL — AB (ref 0–149)
VLDL Cholesterol Cal: 48 mg/dL — ABNORMAL HIGH (ref 5–40)

## 2016-02-01 LAB — COMPREHENSIVE METABOLIC PANEL
ALK PHOS: 82 IU/L (ref 39–117)
ALT: 17 IU/L (ref 0–44)
AST: 10 IU/L (ref 0–40)
Albumin/Globulin Ratio: 1.4 (ref 1.2–2.2)
Albumin: 4.1 g/dL (ref 3.5–5.5)
BUN/Creatinine Ratio: 12 (ref 9–20)
BUN: 11 mg/dL (ref 6–24)
Bilirubin Total: 1.2 mg/dL (ref 0.0–1.2)
CO2: 27 mmol/L (ref 18–29)
CREATININE: 0.9 mg/dL (ref 0.76–1.27)
Calcium: 9.5 mg/dL (ref 8.7–10.2)
Chloride: 99 mmol/L (ref 96–106)
GFR calc Af Amer: 110 mL/min/{1.73_m2} (ref >59)
GFR calc non Af Amer: 95 mL/min/{1.73_m2} (ref >59)
Globulin, Total: 3 g/dL (ref 1.5–4.5)
Glucose: 98 mg/dL (ref 65–99)
POTASSIUM: 4.4 mmol/L (ref 3.5–5.2)
SODIUM: 140 mmol/L (ref 134–144)
Total Protein: 7.1 g/dL (ref 6.0–8.5)

## 2016-02-01 LAB — TSH: TSH: 1.55 u[IU]/mL (ref 0.450–4.500)

## 2016-02-01 LAB — CBC WITH DIFFERENTIAL/PLATELET
BASOS ABS: 0 10*3/uL (ref 0.0–0.2)
Basos: 0 %
EOS (ABSOLUTE): 0.2 10*3/uL (ref 0.0–0.4)
Eos: 3 %
HEMATOCRIT: 42.9 % (ref 37.5–51.0)
Hemoglobin: 15.4 g/dL (ref 12.6–17.7)
IMMATURE GRANULOCYTES: 0 %
Immature Grans (Abs): 0 10*3/uL (ref 0.0–0.1)
LYMPHS ABS: 2.7 10*3/uL (ref 0.7–3.1)
Lymphs: 41 %
MCH: 32.2 pg (ref 26.6–33.0)
MCHC: 35.9 g/dL — AB (ref 31.5–35.7)
MCV: 90 fL (ref 79–97)
MONOS ABS: 0.6 10*3/uL (ref 0.1–0.9)
Monocytes: 8 %
NEUTROS PCT: 48 %
Neutrophils Absolute: 3.2 10*3/uL (ref 1.4–7.0)
PLATELETS: 191 10*3/uL (ref 150–379)
RBC: 4.78 x10E6/uL (ref 4.14–5.80)
RDW: 13.5 % (ref 12.3–15.4)
WBC: 6.8 10*3/uL (ref 3.4–10.8)

## 2016-02-01 LAB — PSA: Prostate Specific Ag, Serum: 1.7 ng/mL (ref 0.0–4.0)

## 2016-02-03 ENCOUNTER — Encounter: Payer: Self-pay | Admitting: Family Medicine

## 2016-02-07 ENCOUNTER — Telehealth: Payer: Self-pay | Admitting: Family Medicine

## 2016-02-07 DIAGNOSIS — Q845 Enlarged and hypertrophic nails: Secondary | ICD-10-CM

## 2016-02-07 NOTE — Telephone Encounter (Signed)
Patient's brother notified of appointment.

## 2016-02-07 NOTE — Telephone Encounter (Signed)
Referral in

## 2016-02-07 NOTE — Telephone Encounter (Signed)
Pt's brother called stated pt needs a referral to a podiatrist for his feet. Please have pt set up with the first available. He usually sees Dr. Clide CliffKline. Pt needs to be seen ASAP. Dr. Wilmon ArmsKline's office informed brother they would need a new referral as pt has not been seen there in over 3 years. Thanks.

## 2016-02-17 ENCOUNTER — Encounter: Payer: Self-pay | Admitting: Family Medicine

## 2016-02-17 ENCOUNTER — Ambulatory Visit (INDEPENDENT_AMBULATORY_CARE_PROVIDER_SITE_OTHER): Payer: Medicare Other | Admitting: Family Medicine

## 2016-02-17 VITALS — BP 125/77 | HR 71 | Temp 97.5°F | Wt 183.0 lb

## 2016-02-17 DIAGNOSIS — H6123 Impacted cerumen, bilateral: Secondary | ICD-10-CM | POA: Diagnosis not present

## 2016-02-17 DIAGNOSIS — S39011A Strain of muscle, fascia and tendon of abdomen, initial encounter: Secondary | ICD-10-CM | POA: Diagnosis not present

## 2016-02-17 NOTE — Progress Notes (Signed)
BP 125/77   Pulse 71   Temp 97.5 F (36.4 C)   Wt 183 lb (83 kg)   SpO2 99%   BMI 28.16 kg/m    Subjective:    Patient ID: Bryan West, male    DOB: 08/05/1960, 55 y.o.   MRN: [id number]  HPI: Bryan PicklesDanny C Polimeni is a 55 y.o. male  Chief Complaint  Patient presents with  . Flank Pain    picked up a bucket of feed Friday and was complaining of some right side pain, has been using heating pad and ibuprofen.   . Cerumen Impaction    has been using the drops, was scheduled to come back in Thursday to have them recheck/cleaned out.   Patient is accompanied by brother, who provides most of the history.   Patient presents with right side pain for 3 days following moving a heavy feed bucket. Deep, aching pain that is worsened by movement and relieved by rest.Taking tylenol, ibuprofen and using a heat pad with good relief. Family member noted a red rash along that side one night and was concerned for shingles. The red area is not raised or painful, and is the area where the heat pad was laying.   Pt also wants to f/u on cerumen impaction. Has been using the debrox drops since previous appt to help soften the stubborn wax build up that was not lavaged out last time. Would like to have a second lavage today if possible. Denies hearing issues or pain in ears.   Relevant past medical, surgical, family and social history reviewed and updated as indicated. Interim medical history since our last visit reviewed. Allergies and medications reviewed and updated.  Review of Systems  Constitutional: Negative.   HENT: Negative.   Eyes: Negative.   Respiratory: Negative.   Cardiovascular: Negative.   Gastrointestinal: Negative.   Genitourinary: Negative.   Musculoskeletal: Positive for back pain.  Skin: Negative.   Neurological: Negative.   Psychiatric/Behavioral: Negative.     Per HPI unless specifically indicated above     Objective:    BP 125/77   Pulse 71   Temp 97.5 F (36.4 C)   Wt 183  lb (83 kg)   SpO2 99%   BMI 28.16 kg/m   Wt Readings from Last 3 Encounters:  02/17/16 183 lb (83 kg)  01/31/16 178 lb (80.7 kg)  07/31/15 177 lb (80.3 kg)    Physical Exam  Constitutional: He appears well-developed and well-nourished. No distress.  HENT:  Head: Atraumatic.  B/l ears with moderate cerumen impaction.   Eyes: Conjunctivae are normal. No scleral icterus.  Neck: Normal range of motion. Neck supple.  Cardiovascular: Normal rate.   Pulmonary/Chest: Effort normal. No respiratory distress.  Musculoskeletal: Normal range of motion. He exhibits tenderness (TTP right flank extending to mid back).  Lymphadenopathy:    He has no cervical adenopathy.  Neurological: He is alert.  Skin: Skin is warm and dry. No erythema.  Psychiatric: He has a normal mood and affect. His behavior is normal.  Nursing note and vitals reviewed.   Procedure: B/l ear lavage performed today with mixture of warm water and hydrogen peroxide. Procedure was well tolerated with no complications noted. Small amount of residual cerumen remains against both TMs, but was able to remove the majority of the build-up. TMs intact upon inspection following lavage.       Assessment & Plan:   Problem List Items Addressed This Visit    None  Visit Diagnoses    Strain of abdominal muscle, initial encounter    -  Primary   Continue ibuprofen, tylenol, rest, and heat to the area as needed. Gentle stretches, rest. Follow up if worsening or no improvement   Bilateral impacted cerumen       Lavage well tolerated. Continue using debrox drops 2-3 times weekly to keep wax soft and from impacting against TMs.        Follow up plan: Return if symptoms worsen or fail to improve.

## 2016-02-17 NOTE — Patient Instructions (Signed)
Follow up as needed. Continue using debrox ear drops 2-3 times weekly for maintenance

## 2016-02-18 ENCOUNTER — Ambulatory Visit: Payer: Medicare Other | Admitting: Family Medicine

## 2016-02-20 DIAGNOSIS — M79674 Pain in right toe(s): Secondary | ICD-10-CM | POA: Diagnosis not present

## 2016-02-20 DIAGNOSIS — M79675 Pain in left toe(s): Secondary | ICD-10-CM | POA: Diagnosis not present

## 2016-02-20 DIAGNOSIS — B351 Tinea unguium: Secondary | ICD-10-CM | POA: Diagnosis not present

## 2016-02-24 ENCOUNTER — Ambulatory Visit: Payer: Medicare Other | Admitting: Family Medicine

## 2016-08-24 DIAGNOSIS — Z808 Family history of malignant neoplasm of other organs or systems: Secondary | ICD-10-CM | POA: Diagnosis not present

## 2016-08-24 DIAGNOSIS — L570 Actinic keratosis: Secondary | ICD-10-CM | POA: Diagnosis not present

## 2016-08-24 DIAGNOSIS — L400 Psoriasis vulgaris: Secondary | ICD-10-CM | POA: Diagnosis not present

## 2016-08-24 DIAGNOSIS — L578 Other skin changes due to chronic exposure to nonionizing radiation: Secondary | ICD-10-CM | POA: Diagnosis not present

## 2017-01-21 ENCOUNTER — Ambulatory Visit: Payer: Self-pay

## 2017-01-28 ENCOUNTER — Encounter: Payer: Medicare Other | Admitting: Family Medicine

## 2017-01-28 NOTE — Progress Notes (Deleted)
There were no vitals taken for this visit.   Subjective:    Patient ID: Bryan West, male    DOB: 02-06-1961, 56 y.o.   MRN: [id number]  HPI: Bryan West is a 56 y.o. male presenting on 01/28/2017 for comprehensive medical examination. Current medical complaints include:{Blank single:19197::"none","***"}  He currently lives with: Interim Problems from his last visit: {Blank single:19197::"yes","no"}  Functional Status Survey:    FALL RISK: No flowsheet data found.  Depression Screen Depression screen PHQ 2/9 01/31/2016  Decreased Interest 0  Down, Depressed, Hopeless 0  PHQ - 2 Score 0    Advanced Directives <no information>  Past Medical History:  Past Medical History:  Diagnosis Date  . Hyperbilirubinemia   . Hyperlipidemia   . Hypertension   . Lymphocytosis   . MR (mental retardation)   . Prostatitis     Surgical History:  Past Surgical History:  Procedure Laterality Date  . CHOLECYSTECTOMY      Medications:  Current Outpatient Prescriptions on File Prior to Visit  Medication Sig  . ketoconazole (NIZORAL) 2 % shampoo   . losartan-hydrochlorothiazide (HYZAAR) 50-12.5 MG tablet Take 1 tablet by mouth daily.   No current facility-administered medications on file prior to visit.     Allergies:  No Known Allergies  Social History:  Social History   Social History  . Marital status: Single    Spouse name: N/A  . Number of children: N/A  . Years of education: N/A   Occupational History  . Not on file.   Social History Main Topics  . Smoking status: Never Smoker  . Smokeless tobacco: Never Used  . Alcohol use No  . Drug use: No  . Sexual activity: Not on file   Other Topics Concern  . Not on file   Social History Narrative  . No narrative on file   History  Smoking Status  . Never Smoker  Smokeless Tobacco  . Never Used   History  Alcohol Use No    Family History:  Family History  Problem Relation Age of Onset  . Pulmonary  fibrosis Mother   . Hypertension Father   . Cancer Father        prostate  . Cancer Brother        skin  . Hypertension Brother     Past medical history, surgical history, medications, allergies, family history and social history reviewed with patient today and changes made to appropriate areas of the chart.   Review of Systems - {ros master:310782} All other ROS negative except what is listed above and in the HPI.      Objective:    There were no vitals taken for this visit.  Wt Readings from Last 3 Encounters:  02/17/16 183 lb (83 kg)  01/31/16 178 lb (80.7 kg)  07/31/15 177 lb (80.3 kg)    Physical Exam  Cognitive Testing - 6-CIT  Correct? Score   What year is it? {YES NO:22349} {Numbers; 0-4:31231} Yes = 0    No = 4  What month is it? {YES NO:22349} {Numbers; 0-4:31231} Yes = 0    No = 3  Remember:     Bryan West, [address], [place]     What time is it? {YES NO:22349} {Numbers; 0-4:31231} Yes = 0    No = 3  Count backwards from 20 to 1 {YES NO:22349} {Numbers; 0-4:31231} Correct = 0    1 error = 2   More than 1  error = 4  Say the months of the year in reverse. {YES NO:22349} {Numbers; 0-4:31231} Correct = 0    1 error = 2   More than 1 error = 4  What address did I ask you to remember? {YES NO:22349} {NUMBERS; 0-10:5044} Correct = 0  1 error = 2    2 error = 4    3 error = 6    4 error = 8    All wrong = 10       TOTAL SCORE  {Numbers; 4-09:81191}/47   Interpretation:  {Desc; normal/abnormal:11317::"Normal"}  Normal (0-7) Abnormal (8-28)    Results for orders placed or performed in visit on 01/31/16  Microscopic Examination  Result Value Ref Range   WBC, UA 0-5 0 - 5 /hpf   RBC, UA None seen 0 - 2 /hpf   Epithelial Cells (non renal) None seen 0 - 10 /hpf   Bacteria, UA None seen None seen/Few  CBC with Differential/Platelet  Result Value Ref Range   WBC 6.8 3.4 - 10.8 x10E3/uL   RBC 4.78 4.14 - 5.80 x10E6/uL   Hemoglobin 15.4 12.6 - 17.7 g/dL   Hematocrit  82.9 56.2 - 51.0 %   MCV 90 79 - 97 fL   MCH 32.2 26.6 - 33.0 pg   MCHC 35.9 (H) 31.5 - 35.7 g/dL   RDW 13.0 86.5 - 78.4 %   Platelets 191 150 - 379 x10E3/uL   Neutrophils 48 Not Estab. %   Lymphs 41 Not Estab. %   Monocytes 8 Not Estab. %   Eos 3 Not Estab. %   Basos 0 Not Estab. %   Neutrophils Absolute 3.2 1.4 - 7.0 x10E3/uL   Lymphocytes Absolute 2.7 0.7 - 3.1 x10E3/uL   Monocytes Absolute 0.6 0.1 - 0.9 x10E3/uL   EOS (ABSOLUTE) 0.2 0.0 - 0.4 x10E3/uL   Basophils Absolute 0.0 0.0 - 0.2 x10E3/uL   Immature Granulocytes 0 Not Estab. %   Immature Grans (Abs) 0.0 0.0 - 0.1 x10E3/uL  Comprehensive metabolic panel  Result Value Ref Range   Glucose 98 65 - 99 mg/dL   BUN 11 6 - 24 mg/dL   Creatinine, Ser 6.96 0.76 - 1.27 mg/dL   GFR calc non Af Amer 95 >59 mL/min/1.73   GFR calc Af Amer 110 >59 mL/min/1.73   BUN/Creatinine Ratio 12 9 - 20   Sodium 140 134 - 144 mmol/L   Potassium 4.4 3.5 - 5.2 mmol/L   Chloride 99 96 - 106 mmol/L   CO2 27 18 - 29 mmol/L   Calcium 9.5 8.7 - 10.2 mg/dL   Total Protein 7.1 6.0 - 8.5 g/dL   Albumin 4.1 3.5 - 5.5 g/dL   Globulin, Total 3.0 1.5 - 4.5 g/dL   Albumin/Globulin Ratio 1.4 1.2 - 2.2   Bilirubin Total 1.2 0.0 - 1.2 mg/dL   Alkaline Phosphatase 82 39 - 117 IU/L   AST 10 0 - 40 IU/L   ALT 17 0 - 44 IU/L  Lipid Panel w/o Chol/HDL Ratio  Result Value Ref Range   Cholesterol, Total 213 (H) 100 - 199 mg/dL   Triglycerides 295 (H) 0 - 149 mg/dL   HDL 43 >28 mg/dL   VLDL Cholesterol Cal 48 (H) 5 - 40 mg/dL   LDL Calculated 413 (H) 0 - 99 mg/dL  Microalbumin, Urine Waived  Result Value Ref Range   Microalb, Ur Waived 30 (H) 0 - 19 mg/L   Creatinine, Urine Waived 300 10 -  300 mg/dL   Microalb/Creat Ratio <30 <30 mg/g  PSA  Result Value Ref Range   Prostate Specific Ag, Serum 1.7 0.0 - 4.0 ng/mL  TSH  Result Value Ref Range   TSH 1.550 0.450 - 4.500 uIU/mL  UA/M w/rflx Culture, Routine  Result Value Ref Range   Specific Gravity, UA  1.025 1.005 - 1.030   pH, UA 5.0 5.0 - 7.5   Color, UA Yellow Yellow   Appearance Ur Clear Clear   Leukocytes, UA Trace (A) Negative   Protein, UA Negative Negative/Trace   Glucose, UA Negative Negative   Ketones, UA Negative Negative   RBC, UA Negative Negative   Bilirubin, UA Negative Negative   Urobilinogen, Ur 0.2 0.2 - 1.0 mg/dL   Nitrite, UA Negative Negative   Microscopic Examination See below:       Assessment & Plan:   Problem List Items Addressed This Visit    None       Preventative Services:  Health Risk Assessment and Personalized Prevention Plan: Bone Mass Measurements: CVD Screening:  Colon Cancer Screening:  Depression Screening:  Diabetes Screening:  Glaucoma Screening:  Hepatitis B vaccine: Hepatitis C screening:  HIV Screening: Flu Vaccine: Lung cancer Screening: Obesity Screening:  Pneumonia Vaccines (2): STI Screening: PSA screening:  Discussed aspirin prophylaxis for myocardial infarction prevention and decision was {Blank single:19197::"it was not indicated","made to continue ASA","made to start ASA","made to stop ASA","that we recommended ASA, and patient refused"}  LABORATORY TESTING:  Health maintenance labs ordered today as discussed above.   The natural history of prostate cancer and ongoing controversy regarding screening and potential treatment outcomes of prostate cancer has been discussed with the patient. The meaning of a false positive PSA and a false negative PSA has been discussed. He indicates understanding of the limitations of this screening test and wishes *** to proceed with screening PSA testing.   IMMUNIZATIONS:   - Tdap: Tetanus vaccination status reviewed: {tetanus status:315746}. - Influenza: {Blank single:19197::"Up to date","Administered today","Postponed to flu season","Refused","Given elsewhere"} - Pneumovax: {Blank single:19197::"Up to date","Administered today","Not applicable","Refused","Given  elsewhere"}  SCREENING: - Colonoscopy: {Blank single:19197::"Up to date","Ordered today","Not applicable","Refused","Done elsewhere"}  Discussed with patient purpose of the colonoscopy is to detect colon cancer at curable precancerous or early stages   PATIENT COUNSELING:    Sexuality: Discussed sexually transmitted diseases, partner selection, use of condoms, avoidance of unintended pregnancy  and contraceptive alternatives.   Advised to avoid cigarette smoking.  I discussed with the patient that most people either abstain from alcohol or drink within safe limits (<=14/week and <=4 drinks/occasion for males, <=7/weeks and <= 3 drinks/occasion for females) and that the risk for alcohol disorders and other health effects rises proportionally with the number of drinks per week and how often a drinker exceeds daily limits.  Discussed cessation/primary prevention of drug use and availability of treatment for abuse.   Diet: Encouraged to adjust caloric intake to maintain  or achieve ideal body weight, to reduce intake of dietary saturated fat and total fat, to limit sodium intake by avoiding high sodium foods and not adding table salt, and to maintain adequate dietary potassium and calcium preferably from fresh fruits, vegetables, and low-fat dairy products.    stressed the importance of regular exercise  Injury prevention: Discussed safety belts, safety helmets, smoke detector, smoking near bedding or upholstery.   Dental health: Discussed importance of regular tooth brushing, flossing, and dental visits.   Follow up plan: NEXT PREVENTATIVE PHYSICAL DUE IN 1 YEAR. No Follow-up  on file.

## 2017-01-29 NOTE — Progress Notes (Signed)
This encounter was created in error - please disregard.

## 2017-02-26 ENCOUNTER — Encounter: Payer: Self-pay | Admitting: Family Medicine

## 2017-02-26 ENCOUNTER — Other Ambulatory Visit: Payer: Self-pay | Admitting: Family Medicine

## 2017-02-26 ENCOUNTER — Ambulatory Visit (INDEPENDENT_AMBULATORY_CARE_PROVIDER_SITE_OTHER): Payer: Medicare Other | Admitting: Family Medicine

## 2017-02-26 VITALS — BP 127/78 | HR 79 | Temp 98.5°F | Ht 68.1 in | Wt 178.4 lb

## 2017-02-26 DIAGNOSIS — Z0000 Encounter for general adult medical examination without abnormal findings: Secondary | ICD-10-CM | POA: Diagnosis not present

## 2017-02-26 DIAGNOSIS — Z114 Encounter for screening for human immunodeficiency virus [HIV]: Secondary | ICD-10-CM

## 2017-02-26 DIAGNOSIS — L409 Psoriasis, unspecified: Secondary | ICD-10-CM

## 2017-02-26 DIAGNOSIS — D72820 Lymphocytosis (symptomatic): Secondary | ICD-10-CM

## 2017-02-26 DIAGNOSIS — R350 Frequency of micturition: Secondary | ICD-10-CM

## 2017-02-26 DIAGNOSIS — Z1159 Encounter for screening for other viral diseases: Secondary | ICD-10-CM | POA: Diagnosis not present

## 2017-02-26 DIAGNOSIS — I10 Essential (primary) hypertension: Secondary | ICD-10-CM | POA: Diagnosis not present

## 2017-02-26 DIAGNOSIS — E782 Mixed hyperlipidemia: Secondary | ICD-10-CM | POA: Diagnosis not present

## 2017-02-26 DIAGNOSIS — Z23 Encounter for immunization: Secondary | ICD-10-CM | POA: Diagnosis not present

## 2017-02-26 LAB — MICROALBUMIN, URINE WAIVED
CREATININE, URINE WAIVED: 200 mg/dL (ref 10–300)
Microalb, Ur Waived: 30 mg/L — ABNORMAL HIGH (ref 0–19)

## 2017-02-26 LAB — UA/M W/RFLX CULTURE, ROUTINE
Bilirubin, UA: NEGATIVE
GLUCOSE, UA: NEGATIVE
Ketones, UA: NEGATIVE
Nitrite, UA: NEGATIVE
PROTEIN UA: NEGATIVE
RBC, UA: NEGATIVE
SPEC GRAV UA: 1.02 (ref 1.005–1.030)
UUROB: 1 mg/dL (ref 0.2–1.0)
pH, UA: 7 (ref 5.0–7.5)

## 2017-02-26 LAB — MICROSCOPIC EXAMINATION
BACTERIA UA: NONE SEEN
RBC MICROSCOPIC, UA: NONE SEEN /HPF (ref 0–?)

## 2017-02-26 MED ORDER — LOSARTAN POTASSIUM-HCTZ 50-12.5 MG PO TABS: 1.0000 | ORAL_TABLET | Freq: Every day | ORAL | 1 refills | Status: DC

## 2017-02-26 NOTE — Assessment & Plan Note (Signed)
Under good control. Continue current regimen. Continue to monitor. Call with any concerns. 

## 2017-02-26 NOTE — Assessment & Plan Note (Signed)
Rechecking levels today. Await results.  

## 2017-02-26 NOTE — Progress Notes (Signed)
BP 127/78 (BP Location: Left Arm, Patient Position: Sitting, Cuff Size: Large)   Pulse 79   Temp 98.5 F (36.9 C)   Ht 5' 8.1" (1.73 m)   Wt 178 lb 6 oz (80.9 kg)   SpO2 98%   BMI 27.04 kg/m    Subjective:    Patient ID: Bryan West, male    DOB: 16-Oct-1960, 56 y.o.   MRN: [id number]  HPI: Bryan West is a 56 y.o. male presenting on 02/26/2017 for comprehensive medical examination. Current medical complaints include:  HYPERTENSION / HYPERLIPIDEMIA Satisfied with current treatment? yes Duration of hypertension: chronic BP monitoring frequency: not checking BP medication side effects: no Past BP meds: lisinopril-hctz Duration of hyperlipidemia: chronic Cholesterol medication side effects: no Cholesterol supplements: none Past cholesterol medications: none Medication compliance: excellent compliance Aspirin: no Recent stressors: no Recurrent headaches: no Visual changes: no Palpitations: no Dyspnea: no Chest pain: no Lower extremity edema: no Dizzy/lightheaded: no  SKIN LESION Duration: chronic Location:  On tail bone Painful: no Itching: no Onset: gradual Context: stable Associated signs and symptoms:  History of skin cancer: no History of precancerous skin lesions: no Family history of skin cancer: no  He currently lives with: brother and his wife Interim Problems from his last visit: no  Functional Status Survey: Is the patient deaf or have difficulty hearing?: No Does the patient have difficulty seeing, even when wearing glasses/contacts?: No Does the patient have difficulty concentrating, remembering, or making decisions?: Yes Does the patient have difficulty walking or climbing stairs?: No Does the patient have difficulty dressing or bathing?: No Does the patient have difficulty doing errands alone such as visiting a doctor's office or shopping?: No  FALL RISK: Fall Risk  02/26/2017  Falls in the past year? No    Depression Screen Depression  screen [facility] 2/9 02/26/2017 01/31/2016  Decreased Interest 0 0  Down, Depressed, Hopeless 0 0  PHQ - 2 Score 0 0    Past Medical History:  Past Medical History:  Diagnosis Date  . Hyperbilirubinemia   . Hyperlipidemia   . Hypertension   . Lymphocytosis   . MR (mental retardation)   . Prostatitis     Surgical History:  Past Surgical History:  Procedure Laterality Date  . CHOLECYSTECTOMY      Medications:  Current Outpatient Medications on File Prior to Visit  Medication Sig  . ketoconazole (NIZORAL) 2 % shampoo   . mometasone (ELOCON) 0.1 % lotion    No current facility-administered medications on file prior to visit.     Allergies:  No Known Allergies  Social History:  Social History   Socioeconomic History  . Marital status: Single    Spouse name: Not on file  . Number of children: Not on file  . Years of education: Not on file  . Highest education level: Not on file  Social Needs  . Financial resource strain: Not on file  . Food insecurity - worry: Not on file  . Food insecurity - inability: Not on file  . Transportation needs - medical: Not on file  . Transportation needs - non-medical: Not on file  Occupational History  . Not on file  Tobacco Use  . Smoking status: Never Smoker  . Smokeless tobacco: Never Used  Substance and Sexual Activity  . Alcohol use: No  . Drug use: No  . Sexual activity: Not on file  Other Topics Concern  . Not on file  Social History Narrative  . Not  on file   Social History   Tobacco Use  Smoking Status Never Smoker  Smokeless Tobacco Never Used   Social History   Substance and Sexual Activity  Alcohol Use No    Family History:  Family History  Problem Relation Age of Onset  . Pulmonary fibrosis Mother   . Hypertension Father   . Cancer Father        prostate  . Cancer Brother        skin  . Hypertension Brother     Past medical history, surgical history, medications, allergies, family history and  social history reviewed with patient today and changes made to appropriate areas of the chart.   Review of Systems  Constitutional: Negative.   HENT: Negative.   Eyes: Negative.   Respiratory: Negative.   Cardiovascular: Negative.   Gastrointestinal: Positive for diarrhea. Negative for abdominal pain, blood in stool, constipation, heartburn, melena, nausea and vomiting.  Genitourinary: Negative.   Musculoskeletal: Negative.   Skin: Positive for rash. Negative for itching.  Neurological: Negative.   Endo/Heme/Allergies: Negative.   Psychiatric/Behavioral: Negative.     All other ROS negative except what is listed above and in the HPI.      Objective:    BP 127/78 (BP Location: Left Arm, Patient Position: Sitting, Cuff Size: Large)   Pulse 79   Temp 98.5 F (36.9 C)   Ht 5' 8.1" (1.73 m)   Wt 178 lb 6 oz (80.9 kg)   SpO2 98%   BMI 27.04 kg/m   Wt Readings from Last 3 Encounters:  02/26/17 178 lb 6 oz (80.9 kg)  01/28/17 178 lb 5 oz (80.9 kg)  02/17/16 183 lb (83 kg)    Physical Exam  Constitutional: He is oriented to person, place, and time. He appears well-developed and well-nourished. No distress.  HENT:  Head: Normocephalic and atraumatic.  Right Ear: Hearing, tympanic membrane, external ear and ear canal normal.  Left Ear: Hearing, tympanic membrane, external ear and ear canal normal.  Nose: Nose normal.  Mouth/Throat: Uvula is midline, oropharynx is clear and moist and mucous membranes are normal. No oropharyngeal exudate.  Eyes: Conjunctivae, EOM and lids are normal. Pupils are equal, round, and reactive to light. Right eye exhibits no discharge. Left eye exhibits no discharge. No scleral icterus.  Neck: Normal range of motion. Neck supple. No JVD present. No tracheal deviation present. No thyromegaly present.  Cardiovascular: Normal rate, regular rhythm, normal heart sounds and intact distal pulses. Exam reveals no gallop and no friction rub.  No murmur  heard. Pulmonary/Chest: Effort normal and breath sounds normal. No stridor. No respiratory distress. He has no wheezes. He has no rales. He exhibits no tenderness.  Abdominal: Soft. Bowel sounds are normal. He exhibits no distension and no mass. There is no tenderness. There is no rebound and no guarding.  Genitourinary:  Genitourinary Comments: Penis and prostate exam deferred  Musculoskeletal: Normal range of motion. He exhibits no edema, tenderness or deformity.  Lymphadenopathy:    He has no cervical adenopathy.  Neurological: He is alert and oriented to person, place, and time. He has normal reflexes. He displays normal reflexes. No cranial nerve deficit. He exhibits normal muscle tone. Coordination normal.  Skin: Skin is warm, dry and intact. No rash noted. He is not diaphoretic. No erythema. No pallor.  Psoriatic plaque at the nuchal crest of buttock  Psychiatric: He has a normal mood and affect. His speech is normal and behavior is normal. Judgment and thought  content normal. Cognition and memory are normal.  Nursing note and vitals reviewed.   6CIT Screen 02/26/2017  What Year? 4 points  What month? 3 points  - unable to answer questions, never has done this   Results for orders placed or performed in visit on 01/31/16  Microscopic Examination  Result Value Ref Range   WBC, UA 0-5 0 - 5 /hpf   RBC, UA None seen 0 - 2 /hpf   Epithelial Cells (non renal) None seen 0 - 10 /hpf   Bacteria, UA None seen None seen/Few  CBC with Differential/Platelet  Result Value Ref Range   WBC 6.8 3.4 - 10.8 x10E3/uL   RBC 4.78 4.14 - 5.80 x10E6/uL   Hemoglobin 15.4 12.6 - 17.7 g/dL   Hematocrit 16.1 09.6 - 51.0 %   MCV 90 79 - 97 fL   MCH 32.2 26.6 - 33.0 pg   MCHC 35.9 (H) 31.5 - 35.7 g/dL   RDW 04.5 40.9 - 81.1 %   Platelets 191 150 - 379 x10E3/uL   Neutrophils 48 Not Estab. %   Lymphs 41 Not Estab. %   Monocytes 8 Not Estab. %   Eos 3 Not Estab. %   Basos 0 Not Estab. %    Neutrophils Absolute 3.2 1.4 - 7.0 x10E3/uL   Lymphocytes Absolute 2.7 0.7 - 3.1 x10E3/uL   Monocytes Absolute 0.6 0.1 - 0.9 x10E3/uL   EOS (ABSOLUTE) 0.2 0.0 - 0.4 x10E3/uL   Basophils Absolute 0.0 0.0 - 0.2 x10E3/uL   Immature Granulocytes 0 Not Estab. %   Immature Grans (Abs) 0.0 0.0 - 0.1 x10E3/uL  Comprehensive metabolic panel  Result Value Ref Range   Glucose 98 65 - 99 mg/dL   BUN 11 6 - 24 mg/dL   Creatinine, Ser 9.14 0.76 - 1.27 mg/dL   GFR calc non Af Amer 95 >59 mL/min/1.73   GFR calc Af Amer 110 >59 mL/min/1.73   BUN/Creatinine Ratio 12 9 - 20   Sodium 140 134 - 144 mmol/L   Potassium 4.4 3.5 - 5.2 mmol/L   Chloride 99 96 - 106 mmol/L   CO2 27 18 - 29 mmol/L   Calcium 9.5 8.7 - 10.2 mg/dL   Total Protein 7.1 6.0 - 8.5 g/dL   Albumin 4.1 3.5 - 5.5 g/dL   Globulin, Total 3.0 1.5 - 4.5 g/dL   Albumin/Globulin Ratio 1.4 1.2 - 2.2   Bilirubin Total 1.2 0.0 - 1.2 mg/dL   Alkaline Phosphatase 82 39 - 117 IU/L   AST 10 0 - 40 IU/L   ALT 17 0 - 44 IU/L  Lipid Panel w/o Chol/HDL Ratio  Result Value Ref Range   Cholesterol, Total 213 (H) 100 - 199 mg/dL   Triglycerides 782 (H) 0 - 149 mg/dL   HDL 43 >95 mg/dL   VLDL Cholesterol Cal 48 (H) 5 - 40 mg/dL   LDL Calculated 621 (H) 0 - 99 mg/dL  Microalbumin, Urine Waived  Result Value Ref Range   Microalb, Ur Waived 30 (H) 0 - 19 mg/L   Creatinine, Urine Waived 300 10 - 300 mg/dL   Microalb/Creat Ratio <30 <30 mg/g  PSA  Result Value Ref Range   Prostate Specific Ag, Serum 1.7 0.0 - 4.0 ng/mL  TSH  Result Value Ref Range   TSH 1.550 0.450 - 4.500 uIU/mL  UA/M w/rflx Culture, Routine  Result Value Ref Range   Specific Gravity, UA 1.025 1.005 - 1.030   pH, UA 5.0 5.0 -  7.5   Color, UA Yellow Yellow   Appearance Ur Clear Clear   Leukocytes, UA Trace (A) Negative   Protein, UA Negative Negative/Trace   Glucose, UA Negative Negative   Ketones, UA Negative Negative   RBC, UA Negative Negative   Bilirubin, UA Negative  Negative   Urobilinogen, Ur 0.2 0.2 - 1.0 mg/dL   Nitrite, UA Negative Negative   Microscopic Examination See below:       Assessment & Plan:   Problem List Items Addressed This Visit      Cardiovascular and Mediastinum   Hypertension    Under good control. Continue current regimen. Continue to monitor. Call with any concerns. Refill given.       Relevant Medications   losartan-hydrochlorothiazide (HYZAAR) 50-12.5 MG tablet   Other Relevant Orders   Comprehensive metabolic panel   Microalbumin, Urine Waived   TSH     Musculoskeletal and Integument   Psoriasis    Continue to follow with dermatology. Continue mometasone. Call with any concerns.         Other   Hyperlipidemia    Under good control. Continue current regimen. Continue to monitor. Call with any concerns.      Relevant Medications   losartan-hydrochlorothiazide (HYZAAR) 50-12.5 MG tablet   Other Relevant Orders   Comprehensive metabolic panel   Lipid Panel w/o Chol/HDL Ratio   Lymphocytosis    Rechecking levels today. Await results.       Relevant Orders   CBC with Differential/Platelet   Hyperbilirubinemia    Rechecking levels today. Await results.        Other Visit Diagnoses    Wellness examination    -  Primary   Preventative care discussed as below.    Routine general medical examination at a health care facility       Vaccines up to date. Screening labs checked today. Colonoscopy up to date. Continue diet and exercise. Call with any concerns.    Immunization due       Flu shot given today   Relevant Orders   Flu Vaccine QUAD 6+ mos PF IM (Fluarix Quad PF) (Completed)   Encounter for hepatitis C screening test for low risk patient       Labs drawn today. Await results.    Relevant Orders   UA/M w/rflx Culture, Routine   Encounter for screening for human immunodeficiency virus (HIV)       Labs drawn today. Await results.    Relevant Orders   HIV antibody   Urinary frequency       PSA  and UA checked today.   Relevant Orders   PSA   UA/M w/rflx Culture, Routine       Preventative Services:  Health Risk Assessment and Personalized Prevention Plan: Done today Bone Mass Measurements: N/A CVD Screening: Done today Colon Cancer Screening: Up to date Depression Screening: Done today Diabetes Screening: Done today Glaucoma Screening: See your eye doctor Hepatitis B vaccine: N/A Hepatitis C screening: Done today HIV Screening: Done today Flu Vaccine: Done today Lung cancer Screening: N/A Obesity Screening: Done today Pneumonia Vaccines (2): N/A STI Screening: N/A PSA screening: Done today  LABORATORY TESTING:  Health maintenance labs ordered today as discussed above.   The natural history of prostate cancer and ongoing controversy regarding screening and potential treatment outcomes of prostate cancer has been discussed with the patient. The meaning of a false positive PSA and a false negative PSA has been discussed. He indicates understanding of the  limitations of this screening test and wishes to proceed with screening PSA testing.   IMMUNIZATIONS:   - Tdap: Tetanus vaccination status reviewed: last tetanus booster within 10 years. - Influenza: Administered today - Pneumovax: Not applicable - Prevnar: Not applicable - Zostavax vaccine: Refused  SCREENING: - Colonoscopy: Up to date  Discussed with patient purpose of the colonoscopy is to detect colon cancer at curable precancerous or early stages   PATIENT COUNSELING:    Sexuality: Discussed sexually transmitted diseases, partner selection, use of condoms, avoidance of unintended pregnancy  and contraceptive alternatives.   Advised to avoid cigarette smoking.  I discussed with the patient that most people either abstain from alcohol or drink within safe limits (<=14/week and <=4 drinks/occasion for males, <=7/weeks and <= 3 drinks/occasion for females) and that the risk for alcohol disorders and other  health effects rises proportionally with the number of drinks per week and how often a drinker exceeds daily limits.  Discussed cessation/primary prevention of drug use and availability of treatment for abuse.   Diet: Encouraged to adjust caloric intake to maintain  or achieve ideal body weight, to reduce intake of dietary saturated fat and total fat, to limit sodium intake by avoiding high sodium foods and not adding table salt, and to maintain adequate dietary potassium and calcium preferably from fresh fruits, vegetables, and low-fat dairy products.    stressed the importance of regular exercise  Injury prevention: Discussed safety belts, safety helmets, smoke detector, smoking near bedding or upholstery.   Dental health: Discussed importance of regular tooth brushing, flossing, and dental visits.   Follow up plan: NEXT PREVENTATIVE PHYSICAL DUE IN 1 YEAR. Return in about 6 months (around 08/26/2017) for Follow up.

## 2017-02-26 NOTE — Patient Instructions (Addendum)
Preventative Services:  Health Risk Assessment and Personalized Prevention Plan: Done today Bone Mass Measurements: N/A CVD Screening: Done today Colon Cancer Screening: Up to date Depression Screening: Done today Diabetes Screening: Done today Glaucoma Screening: See your eye doctor Hepatitis B vaccine: N/A Hepatitis C screening: Done today HIV Screening: Done today Flu Vaccine: Done today Lung cancer Screening: N/A Obesity Screening: Done today Pneumonia Vaccines (2): N/A STI Screening: N/A PSA screening: Done today Influenza (Flu) Vaccine (Inactivated or Recombinant): What You Need to Know 1. Why get vaccinated? Influenza ("flu") is a contagious disease that spreads around the [place] every year, usually between October and May. Flu is caused by influenza viruses, and is spread mainly by coughing, sneezing, and close contact. Anyone can get flu. Flu strikes suddenly and can last several days. Symptoms vary by age, but can include:  fever/chills  sore throat  muscle aches  fatigue  cough  headache  runny or stuffy nose  Flu can also lead to pneumonia and blood infections, and cause diarrhea and seizures in children. If you have a medical condition, such as heart or lung disease, flu can make it worse. Flu is more dangerous for some people. Infants and young children, people 57 years of age and older, pregnant women, and people with certain health conditions or a weakened immune system are at greatest risk. Each year thousands of people in the [place] die from flu, and many more are hospitalized. Flu vaccine can:  keep you from getting flu,  make flu less severe if you do get it, and  keep you from spreading flu to your family and other people. 2. Inactivated and recombinant flu vaccines A dose of flu vaccine is recommended every flu season. Children 6 months through 248 years of age may need two doses during the same flu season. Everyone else needs only one  dose each flu season. Some inactivated flu vaccines contain a very small amount of a mercury-based preservative called thimerosal. Studies have not shown thimerosal in vaccines to be harmful, but flu vaccines that do not contain thimerosal are available. There is no live flu virus in flu shots. They cannot cause the flu. There are many flu viruses, and they are always changing. Each year a new flu vaccine is made to protect against three or four viruses that are likely to cause disease in the upcoming flu season. But even when the vaccine doesn't exactly match these viruses, it may still provide some protection. Flu vaccine cannot prevent:  flu that is caused by a virus not covered by the vaccine, or  illnesses that look like flu but are not.  It takes about 2 weeks for protection to develop after vaccination, and protection lasts through the flu season. 3. Some people should not get this vaccine Tell the person who is giving you the vaccine:  If you have any severe, life-threatening allergies. If you ever had a life-threatening allergic reaction after a dose of flu vaccine, or have a severe allergy to any part of this vaccine, you may be advised not to get vaccinated. Most, but not all, types of flu vaccine contain a small amount of egg protein.  If you ever had Guillain-Barr Syndrome (also called GBS). Some people with a history of GBS should not get this vaccine. This should be discussed with your doctor.  If you are not feeling well. It is usually okay to get flu vaccine when you have a mild illness, but you might be asked  to come back when you feel better.  4. Risks of a vaccine reaction With any medicine, including vaccines, there is a chance of reactions. These are usually mild and go away on their own, but serious reactions are also possible. Most people who get a flu shot do not have any problems with it. Minor problems following a flu shot include:  soreness, redness, or swelling  where the shot was given  hoarseness  sore, red or itchy eyes  cough  fever  aches  headache  itching  fatigue  If these problems occur, they usually begin soon after the shot and last 1 or 2 days. More serious problems following a flu shot can include the following:  There may be a small increased risk of Guillain-Barre Syndrome (GBS) after inactivated flu vaccine. This risk has been estimated at 1 or 2 additional cases per million people vaccinated. This is much lower than the risk of severe complications from flu, which can be prevented by flu vaccine.  Young children who get the flu shot along with pneumococcal vaccine (PCV13) and/or DTaP vaccine at the same time might be slightly more likely to have a seizure caused by fever. Ask your doctor for more information. Tell your doctor if a child who is getting flu vaccine has ever had a seizure.  Problems that could happen after any injected vaccine:  People sometimes faint after a medical procedure, including vaccination. Sitting or lying down for about 15 minutes can help prevent fainting, and injuries caused by a fall. Tell your doctor if you feel dizzy, or have vision changes or ringing in the ears.  Some people get severe pain in the shoulder and have difficulty moving the arm where a shot was given. This happens very rarely.  Any medication can cause a severe allergic reaction. Such reactions from a vaccine are very rare, estimated at about 1 in a million doses, and would happen within a few minutes to a few hours after the vaccination. As with any medicine, there is a very remote chance of a vaccine causing a serious injury or death. The safety of vaccines is always being monitored. For more information, visit: http://floyd.org/ 5. What if there is a serious reaction? What should I look for? Look for anything that concerns you, such as signs of a severe allergic reaction, very high fever, or unusual  behavior. Signs of a severe allergic reaction can include hives, swelling of the face and throat, difficulty breathing, a fast heartbeat, dizziness, and weakness. These would start a few minutes to a few hours after the vaccination. What should I do?  If you think it is a severe allergic reaction or other emergency that can't wait, call 9-1-1 and get the person to the nearest hospital. Otherwise, call your doctor.  Reactions should be reported to the Vaccine Adverse Event Reporting System (VAERS). Your doctor should file this report, or you can do it yourself through the VAERS web site at www.vaers.LAgents.no, or by calling 1-902-010-5079. ? VAERS does not give medical advice. 6. The National Vaccine Injury Compensation Program The [employer] Vaccine Injury Compensation Program (VICP) is a federal program that was created to compensate people who may have been injured by certain vaccines. Persons who believe they may have been injured by a vaccine can learn about the program and about filing a claim by calling 1-4693014782 or visiting the VICP website at SpiritualWord.at. There is a time limit to file a claim for compensation. 7. How can I learn  more?  Ask your healthcare provider. He or she can give you the vaccine package insert or suggest other sources of information.  Call your local or state health department.  Contact the Centers for Disease Control and Prevention (CDC): ? Call (760)230-72271-743-487-4079 (1-800-CDC-INFO) or ? Visit CDC's website at BiotechRoom.com.cywww.cdc.gov/flu Vaccine Information Statement, Inactivated Influenza Vaccine (11/17/2013) This information is not intended to replace advice given to you by your health care provider. Make sure you discuss any questions you have with your health care provider. Document Released: 01/22/2006 Document Revised: 12/19/2015 Document Reviewed: 12/19/2015 Elsevier Interactive Patient Education  2017 Elsevier Inc.  Health Maintenance, Male A  healthy lifestyle and preventive care is important for your health and wellness. Ask your health care provider about what schedule of regular examinations is right for you. What should I know about weight and diet? Eat a Healthy Diet  Eat plenty of vegetables, fruits, whole grains, low-fat dairy products, and lean protein.  Do not eat a lot of foods high in solid fats, added sugars, or salt.  Maintain a Healthy Weight Regular exercise can help you achieve or maintain a healthy weight. You should:  Do at least 150 minutes of exercise each week. The exercise should increase your heart rate and make you sweat (moderate-intensity exercise).  Do strength-training exercises at least twice a week.  Watch Your Levels of Cholesterol and Blood Lipids  Have your blood tested for lipids and cholesterol every 5 years starting at 57 years of age. If you are at high risk for heart disease, you should start having your blood tested when you are 57 years old. You may need to have your cholesterol levels checked more often if: ? Your lipid or cholesterol levels are high. ? You are older than 57 years of age. ? You are at high risk for heart disease.  What should I know about cancer screening? Many types of cancers can be detected early and may often be prevented. Lung Cancer  You should be screened every year for lung cancer if: ? You are a current smoker who has smoked for at least 30 years. ? You are a former smoker who has quit within the past 15 years.  Talk to your health care provider about your screening options, when you should start screening, and how often you should be screened.  Colorectal Cancer  Routine colorectal cancer screening usually begins at 57 years of age and should be repeated every 5-10 years until you are 57 years old. You may need to be screened more often if early forms of precancerous polyps or small growths are found. Your health care provider may recommend screening at  an earlier age if you have risk factors for colon cancer.  Your health care provider may recommend using home test kits to check for hidden blood in the stool.  A small camera at the end of a tube can be used to examine your colon (sigmoidoscopy or colonoscopy). This checks for the earliest forms of colorectal cancer.  Prostate and Testicular Cancer  Depending on your age and overall health, your health care provider may do certain tests to screen for prostate and testicular cancer.  Talk to your health care provider about any symptoms or concerns you have about testicular or prostate cancer.  Skin Cancer  Check your skin from head to toe regularly.  Tell your health care provider about any new moles or changes in moles, especially if: ? There is a change in a mole's size,  shape, or color. ? You have a mole that is larger than a pencil eraser.  Always use sunscreen. Apply sunscreen liberally and repeat throughout the day.  Protect yourself by wearing long sleeves, pants, a wide-brimmed hat, and sunglasses when outside.  What should I know about heart disease, diabetes, and high blood pressure?  If you are 28-1 years of age, have your blood pressure checked every 3-5 years. If you are 75 years of age or older, have your blood pressure checked every year. You should have your blood pressure measured twice-once when you are at a hospital or clinic, and once when you are not at a hospital or clinic. Record the average of the two measurements. To check your blood pressure when you are not at a hospital or clinic, you can use: ? An automated blood pressure machine at a pharmacy. ? A home blood pressure monitor.  Talk to your health care provider about your target blood pressure.  If you are between 39-78 years old, ask your health care provider if you should take aspirin to prevent heart disease.  Have regular diabetes screenings by checking your fasting blood sugar level. ? If you are  at a normal weight and have a low risk for diabetes, have this test once every three years after the age of 100. ? If you are overweight and have a high risk for diabetes, consider being tested at a younger age or more often.  A one-time screening for abdominal aortic aneurysm (AAA) by ultrasound is recommended for men aged 65-75 years who are current or former smokers. What should I know about preventing infection? Hepatitis B If you have a higher risk for hepatitis B, you should be screened for this virus. Talk with your health care provider to find out if you are at risk for hepatitis B infection. Hepatitis C Blood testing is recommended for:  Everyone born from [birth year] through 1966.  Anyone with known risk factors for hepatitis C.  Sexually Transmitted Diseases (STDs)  You should be screened each year for STDs including gonorrhea and chlamydia if: ? You are sexually active and are younger than 57 years of age. ? You are older than 57 years of age and your health care provider tells you that you are at risk for this type of infection. ? Your sexual activity has changed since you were last screened and you are at an increased risk for chlamydia or gonorrhea. Ask your health care provider if you are at risk.  Talk with your health care provider about whether you are at high risk of being infected with HIV. Your health care provider may recommend a prescription medicine to help prevent HIV infection.  What else can I do?  Schedule regular health, dental, and eye exams.  Stay current with your vaccines (immunizations).  Do not use any tobacco products, such as cigarettes, chewing tobacco, and e-cigarettes. If you need help quitting, ask your health care provider.  Limit alcohol intake to no more than 2 drinks per day. One drink equals 12 ounces of beer, 5 ounces of wine, or 1 ounces of hard liquor.  Do not use street drugs.  Do not share needles.  Ask your health care provider for  help if you need support or information about quitting drugs.  Tell your health care provider if you often feel depressed.  Tell your health care provider if you have ever been abused or do not feel safe at home. This information is not intended  to replace advice given to you by your health care provider. Make sure you discuss any questions you have with your health care provider. Document Released: 09/26/2007 Document Revised: 11/27/2015 Document Reviewed: 01/01/2015 Elsevier Interactive Patient Education  2018 Elsevier Inc. Cholestyramine powder for oral suspension What is this medicine? CHOLESTYRAMINE (koe LESS tir a meen) is used to lower cholesterol in patients who are at risk of heart disease or stroke. This medicine is only for patients whose cholesterol level is not controlled by diet. This medicine may be used for other purposes; ask your health care provider or pharmacist if you have questions. COMMON BRAND NAME(S): Locholest, Locholest Light, Prevalite, Questran, Questran Light What should I tell my health care provider before I take this medicine? They need to know if you have any of these conditions: -blocked bile duct -an unusual or allergic reaction to cholestyramine, other medicines, foods, dyes, or preservatives -pregnant or trying to get pregnant -breast-feeding How should I use this medicine? Do not take this medicine in the dry form. It must be mixed with a liquid before swallowing. Follow the directions on the prescription label. Place the powder in a glass or cup. Add between 2 and 6 ounces of fluid. This can be water, milk, pulpy fruit juice, fluid soup, or other liquid. Mix well and drink all of the liquid. Take your doses at regular intervals. Do not take your medicine more often than directed. Talk to your pediatrician regarding the use of this medicine in children. Special care may be needed. Overdosage: If you think you have taken too much of this medicine contact a  poison control center or emergency room at once. NOTE: This medicine is only for you. Do not share this medicine with others. What if I miss a dose? If you miss a dose, take it as soon as you can. If it is almost time for your next dose, take only that dose. Do not take double or extra doses. What may interact with this medicine? -diuretics -male hormones, like estrogens or progestins and birth control pills -heart medicines such as digoxin or digitoxin -penicillin G -phenobarbital -phenylbutazone -phytonadione -propranolol -tetracycline antibiotics -thyroid hormones -vitamin A -vitamin D -vitamin E -warfarin Take other drugs at least 1 hour before or 4 hours after this medicine, to avoid decreasing their absorption. This list may not describe all possible interactions. Give your health care provider a list of all the medicines, herbs, non-prescription drugs, or dietary supplements you use. Also tell them if you smoke, drink alcohol, or use illegal drugs. Some items may interact with your medicine. What should I watch for while using this medicine? Visit your doctor or health care professional for regular checks on your progress. Your blood fats and other tests will be measured from time to time. This medicine is only part of a total cholesterol-lowering program. Your health care professional or dietician can suggest a low-cholesterol and low-fat diet that will reduce your risk of getting heart and blood vessel disease. Avoid alcohol and smoking, and keep a proper exercise schedule. To reduce the chance of getting constipated, drink plenty of water and increase the amount of fiber in your diet. Ask your doctor or health care professional for advice if you are constipated. What side effects may I notice from receiving this medicine? Side effects that you should report to your doctor or health care professional as soon as possible: -allergic reactions like skin rash, itching or hives,  swelling of the face, lips, or tongue -bloody  or black, tarry stools -severe stomach pain with nausea and vomiting -unusual bleeding Side effects that usually do not require medical attention (report to your doctor or health care professional if they continue or are bothersome): -constipation or diarrhea -dizziness -headache -heartburn, indigestion -nausea, vomiting -perianal irritation This list may not describe all possible side effects. Call your doctor for medical advice about side effects. You may report side effects to FDA at 1-800-FDA-1088. Where should I keep my medicine? Keep out of the reach of children. Store at room temperature between 15 and 30 degrees C (59 and 86 degrees F). Throw away any unused medicine after the expiration date. NOTE: This sheet is a summary. It may not cover all possible information. If you have questions about this medicine, talk to your doctor, pharmacist, or health care provider.  2018 Elsevier/Gold Standard (2007-07-05 15:33:42)

## 2017-02-26 NOTE — Assessment & Plan Note (Signed)
Under good control. Continue current regimen. Continue to monitor. Call with any concerns. Refill given.  

## 2017-02-26 NOTE — Assessment & Plan Note (Signed)
Continue to follow with dermatology. Continue mometasone. Call with any concerns.

## 2017-02-27 LAB — CBC WITH DIFFERENTIAL/PLATELET
Basophils Absolute: 0 10*3/uL (ref 0.0–0.2)
Basos: 0 %
EOS (ABSOLUTE): 0.2 10*3/uL (ref 0.0–0.4)
EOS: 2 %
HEMATOCRIT: 45.2 % (ref 37.5–51.0)
HEMOGLOBIN: 15.2 g/dL (ref 13.0–17.7)
IMMATURE GRANS (ABS): 0 10*3/uL (ref 0.0–0.1)
Immature Granulocytes: 0 %
LYMPHS ABS: 3 10*3/uL (ref 0.7–3.1)
Lymphs: 42 %
MCH: 31 pg (ref 26.6–33.0)
MCHC: 33.6 g/dL (ref 31.5–35.7)
MCV: 92 fL (ref 79–97)
MONOCYTES: 8 %
Monocytes Absolute: 0.5 10*3/uL (ref 0.1–0.9)
NEUTROS ABS: 3.4 10*3/uL (ref 1.4–7.0)
Neutrophils: 48 %
Platelets: 223 10*3/uL (ref 150–379)
RBC: 4.91 x10E6/uL (ref 4.14–5.80)
RDW: 14.3 % (ref 12.3–15.4)
WBC: 7.2 10*3/uL (ref 3.4–10.8)

## 2017-02-27 LAB — COMPREHENSIVE METABOLIC PANEL
ALBUMIN: 4.3 g/dL (ref 3.5–5.5)
ALK PHOS: 86 IU/L (ref 39–117)
ALT: 22 IU/L (ref 0–44)
AST: 7 IU/L (ref 0–40)
Albumin/Globulin Ratio: 1.5 (ref 1.2–2.2)
BUN / CREAT RATIO: 11 (ref 9–20)
BUN: 11 mg/dL (ref 6–24)
Bilirubin Total: 1.1 mg/dL (ref 0.0–1.2)
CO2: 27 mmol/L (ref 20–29)
CREATININE: 1.04 mg/dL (ref 0.76–1.27)
Calcium: 9.3 mg/dL (ref 8.7–10.2)
Chloride: 95 mmol/L — ABNORMAL LOW (ref 96–106)
GFR calc non Af Amer: 79 mL/min/{1.73_m2} (ref >59)
GFR, EST AFRICAN AMERICAN: 92 mL/min/{1.73_m2} (ref >59)
GLOBULIN, TOTAL: 2.9 g/dL (ref 1.5–4.5)
Glucose: 91 mg/dL (ref 65–99)
Potassium: 4.4 mmol/L (ref 3.5–5.2)
SODIUM: 136 mmol/L (ref 134–144)
TOTAL PROTEIN: 7.2 g/dL (ref 6.0–8.5)

## 2017-02-27 LAB — LIPID PANEL W/O CHOL/HDL RATIO
CHOLESTEROL TOTAL: 212 mg/dL — AB (ref 100–199)
HDL: 40 mg/dL (ref >39)
LDL CALC: 108 mg/dL — AB (ref 0–99)
Triglycerides: 322 mg/dL — ABNORMAL HIGH (ref 0–149)
VLDL Cholesterol Cal: 64 mg/dL — ABNORMAL HIGH (ref 5–40)

## 2017-02-27 LAB — HIV ANTIBODY (ROUTINE TESTING W REFLEX): HIV SCREEN 4TH GENERATION: NONREACTIVE

## 2017-02-27 LAB — PSA: PROSTATE SPECIFIC AG, SERUM: 1.4 ng/mL (ref 0.0–4.0)

## 2017-02-27 LAB — TSH: TSH: 1.16 u[IU]/mL (ref 0.450–4.500)

## 2017-03-02 ENCOUNTER — Encounter: Payer: Self-pay | Admitting: Family Medicine

## 2017-06-16 ENCOUNTER — Ambulatory Visit: Payer: Self-pay | Admitting: *Deleted

## 2017-06-16 NOTE — Telephone Encounter (Signed)
Pt   Bites  His fingernails  He  Is   Mentally challenged  According to  His  Brother. He  States  He  Noticed  Some  redness  And  puffiness  To the  Affected   Fingers .  Hard  To  Judge  Pain  Scale  But brother  States  Is  Probably  Mild.  Advised  Warm  Soaks  To  Area . Appt  Made  By   Clydie BraunKaren  From practice   For  2 pm  Tommorow.   Reason for Disposition . Redness and painful skin around fingernail (cuticle, nailfold)  Answer Assessment - Initial Assessment Questions 1. ONSET: "When did the pain start?"         Yest   Noticed  Some  puffyness  And  Redness   2. LOCATION and RADIATION: "Where is the pain located?"  (e.g., fingertip, around nail, joint, entire  finger)      r  Index  Finger   And  r  Pinky   Around  Nail bed   3. SEVERITY: "How bad is the pain?" "What does it keep you from doing?"   (Scale 1-10; or mild, moderate, severe)  - MILD - doesn't interfere with normal activities   - MODERATE - interferes with normal activities or awakens from sleep  - SEVERE - excruciating pain, unable to hold a glass of water or bend finger even a little      Hard  To  Access   Due to  Him  Being  Challenged  Mild    4. APPEARANCE: "What does the finger look like?" (e.g., redness, swelling, bruising, pallor)     Red   And  Puffy   5. WORK OR EXERCISE: "Has there been any recent work or exercise that involved this part of the body?"       Bites  His  Fingernails    6. CAUSE: "What do you think is causing the pain?"     May be  Infected    7. AGGRAVATING FACTORS: "What makes the pain worse?" (e.g., using computer)        Hard  To  Access   8. OTHER SYMPTOMS: "Do you have any other symptoms?" (e.g., fever, neck pain, numbness)     no 9. PREGNANCY: "Is there any chance you are pregnant?" "When was your last menstrual period?"     N/A  Protocols used: FINGER PAIN-A-AH

## 2017-06-17 ENCOUNTER — Ambulatory Visit (INDEPENDENT_AMBULATORY_CARE_PROVIDER_SITE_OTHER): Payer: Medicare Other | Admitting: Family Medicine

## 2017-06-17 ENCOUNTER — Encounter: Payer: Self-pay | Admitting: Family Medicine

## 2017-06-17 VITALS — BP 124/82 | HR 87 | Temp 98.5°F | Wt 184.4 lb

## 2017-06-17 DIAGNOSIS — Z23 Encounter for immunization: Secondary | ICD-10-CM

## 2017-06-17 DIAGNOSIS — L03011 Cellulitis of right finger: Secondary | ICD-10-CM | POA: Diagnosis not present

## 2017-06-17 NOTE — Progress Notes (Signed)
BP 124/82 (BP Location: Left Arm, Patient Position: Sitting, Cuff Size: Normal)   Pulse 87   Temp 98.5 F (36.9 C) (Oral)   Wt 184 lb 7 oz (83.7 kg)   SpO2 97%   BMI 27.96 kg/m    Subjective:    Patient ID: Bryan West, male    DOB: 02/19/1961, 57 y.o.   MRN: [id number]  HPI: Bryan West is a 57 y.o. male  Chief Complaint  Patient presents with  . Hand Pain    pus and discomfort in right index finger below the fingernail.   Pt here today with pain, swelling, and abscess below cuticle of right index finger x 4 days. Denies drainage, fevers, sweats, body aches. Does have a bad habit of chewing his nails off which he thinks started this issue. Has tried a few epsom salt soaks with no relief.   Relevant past medical, surgical, family and social history reviewed and updated as indicated. Interim medical history since our last visit reviewed. Allergies and medications reviewed and updated.  Review of Systems  Per HPI unless specifically indicated above     Objective:    BP 124/82 (BP Location: Left Arm, Patient Position: Sitting, Cuff Size: Normal)   Pulse 87   Temp 98.5 F (36.9 C) (Oral)   Wt 184 lb 7 oz (83.7 kg)   SpO2 97%   BMI 27.96 kg/m   Wt Readings from Last 3 Encounters:  06/17/17 184 lb 7 oz (83.7 kg)  02/26/17 178 lb 6 oz (80.9 kg)  01/28/17 178 lb 5 oz (80.9 kg)    Physical Exam  Constitutional: He appears well-developed and well-nourished. No distress.  Eyes: Conjunctivae are normal. Pupils are equal, round, and reactive to light.  Neck: Normal range of motion. Neck supple.  Cardiovascular: Normal rate and normal heart sounds.  Pulmonary/Chest: Effort normal and breath sounds normal. No respiratory distress.  Musculoskeletal: Normal range of motion.  Neurological: He is alert.  Skin: Skin is warm and dry. There is erythema.  Large paronychia at cuticle of right index finger with some mild cellulitis coming down into finger  Psychiatric: He has a normal  mood and affect. His behavior is normal.  Nursing note and vitals reviewed.  Procedure: I and D of paronychia, right index finger  Procedure explained in detail and questions answered adequately.  Area sterilized with alcohol and infiltrated with 2 cc 1% licodaine with epinephrine. Area then sterilized with betadine, and a #15 scalpel used to open the abscess. Pressure and gauze to drain thick contents from wound. Dressed with antibacterial ointment and wrapped with gauze and tape. Wound care reviewed. Pt tolerated procedure well with no immediate complications noted.      Assessment & Plan:   Problem List Items Addressed This Visit    None    Visit Diagnoses    Paronychia of finger, right    -  Primary   From cuticle damage from pt chewing nail and cuticle. Abscess drained and sterilized, wrapped, wound care reviewed. Start bactrim x 1 week. Td renewed   Relevant Medications   sulfamethoxazole-trimethoprim (BACTRIM DS,SEPTRA DS) 800-160 MG tablet   Other Relevant Orders   Td : Tetanus/diphtheria >7yo Preservative  free (Completed)   Need for tetanus booster       Relevant Orders   Td : Tetanus/diphtheria >7yo Preservative  free (Completed)       Follow up plan: Return if symptoms worsen or fail to improve.

## 2017-06-17 NOTE — Patient Instructions (Addendum)
Td Vaccine (Tetanus and Diphtheria): What You Need to Know 1. Why get vaccinated? Tetanus  and diphtheria are very serious diseases. They are rare in the [place] today, but people who do become infected often have severe complications. Td vaccine is used to protect adolescents and adults from both of these diseases. Both tetanus and diphtheria are infections caused by bacteria. Diphtheria spreads from person to person through coughing or sneezing. Tetanus-causing bacteria enter the body through cuts, scratches, or wounds. TETANUS (lockjaw) causes painful muscle tightening and stiffness, usually all over the body.  It can lead to tightening of muscles in the head and neck so you can't open your mouth, swallow, or sometimes even breathe. Tetanus kills about 1 out of every 10 people who are infected even after receiving the best medical care.  DIPHTHERIA can cause a thick coating to form in the back of the throat.  It can lead to breathing problems, paralysis, heart failure, and death.  Before vaccines, as many as 200,000 cases of diphtheria and hundreds of cases of tetanus were reported in the [place] each year. Since vaccination began, reports of cases for both diseases have dropped by about 99%. 2. Td vaccine Td vaccine can protect adolescents and adults from tetanus and diphtheria. Td is usually given as a booster dose every 10 years but it can also be given earlier after a severe and dirty wound or burn. Another vaccine, called Tdap, which protects against pertussis in addition to tetanus and diphtheria, is sometimes recommended instead of Td vaccine. Your doctor or the person giving you the vaccine can give you more information. Td may safely be given at the same time as other vaccines. 3. Some people should not get this vaccine  A person who has ever had a life-threatening allergic reaction after a previous dose of any tetanus or diphtheria containing vaccine, OR has a severe  allergy to any part of this vaccine, should not get Td vaccine. Tell the person giving the vaccine about any severe allergies.  Talk to your doctor if you: ? had severe pain or swelling after any vaccine containing diphtheria or tetanus, ? ever had a condition called Guillain Barre Syndrome (GBS), ? aren't feeling well on the day the shot is scheduled. 4. What are the risks from Td vaccine? With any medicine, including vaccines, there is a chance of side effects. These are usually mild and go away on their own. Serious reactions are also possible but are rare. Most people who get Td vaccine do not have any problems with it. Mild problems following Td vaccine: (Did not interfere with activities)  Pain where the shot was given (about 8 people in 10)  Redness or swelling where the shot was given (about 1 person in 4)  Mild fever (rare)  Headache (about 1 person in 4)  Tiredness (about 1 person in 4)  Moderate problems following Td vaccine: (Interfered with activities, but did not require medical attention)  Fever over 102F (rare)  Severe problems following Td vaccine: (Unable to perform usual activities; required medical attention)  Swelling, severe pain, bleeding and/or redness in the arm where the shot was given (rare).  Problems that could happen after any vaccine:  People sometimes faint after a medical procedure, including vaccination. Sitting or lying down for about 15 minutes can help prevent fainting, and injuries caused by a fall. Tell your doctor if you feel dizzy, or have vision changes or ringing in the ears.  Some people get   severe pain in the shoulder and have difficulty moving the arm where a shot was given. This happens very rarely.  Any medication can cause a severe allergic reaction. Such reactions from a vaccine are very rare, estimated at fewer than 1 in a million doses, and would happen within a few minutes to a few hours after the vaccination. As with any  medicine, there is a very remote chance of a vaccine causing a serious injury or death. The safety of vaccines is always being monitored. For more information, visit: www.cdc.gov/vaccinesafety/ 5. What if there is a serious reaction? What should I look for? Look for anything that concerns you, such as signs of a severe allergic reaction, very high fever, or unusual behavior. Signs of a severe allergic reaction can include hives, swelling of the face and throat, difficulty breathing, a fast heartbeat, dizziness, and weakness. These would usually start a few minutes to a few hours after the vaccination. What should I do?  If you think it is a severe allergic reaction or other emergency that can't wait, call 9-1-1 or get the person to the nearest hospital. Otherwise, call your doctor.  Afterward, the reaction should be reported to the Vaccine Adverse Event Reporting System (VAERS). Your doctor might file this report, or you can do it yourself through the VAERS web site at www.vaers.hhs.gov, or by calling 1-800-822-7967. ? VAERS does not give medical advice. 6. The National Vaccine Injury Compensation Program The National Vaccine Injury Compensation Program (VICP) is a federal program that was created to compensate people who may have been injured by certain vaccines. Persons who believe they may have been injured by a vaccine can learn about the program and about filing a claim by calling 1-800-338-2382 or visiting the VICP website at www.hrsa.gov/vaccinecompensation. There is a time limit to file a claim for compensation. 7. How can I learn more?  Ask your doctor. He or she can give you the vaccine package insert or suggest other sources of information.  Call your local or state health department.  Contact the Centers for Disease Control and Prevention (CDC): ? Call 1-800-232-4636 (1-800-CDC-INFO) ? Visit CDC's website at www.cdc.gov/vaccines CDC Td Vaccine VIS (07/23/15) This information is  not intended to replace advice given to you by your health care provider. Make sure you discuss any questions you have with your health care provider. Document Released: 01/25/2006 Document Revised: 12/19/2015 Document Reviewed: 12/19/2015 Elsevier Interactive Patient Education  2017 [employer] Inc.  

## 2017-06-18 ENCOUNTER — Telehealth: Payer: Self-pay | Admitting: Family Medicine

## 2017-06-18 MED ORDER — SULFAMETHOXAZOLE-TRIMETHOPRIM 800-160 MG PO TABS: 1.0000 | ORAL_TABLET | Freq: Two times a day (BID) | ORAL | 0 refills | Status: DC

## 2017-06-18 NOTE — Telephone Encounter (Signed)
Patient's brother stopped by office to wait for provider to call in antibiotic for patient. Patient's brother asked if he could wait in the lobby to see if patient can be called in medication soon. Informed him that provider is seeing patient's this morning and will check messages.    Please Advise.  Thank you

## 2017-06-18 NOTE — Telephone Encounter (Signed)
Rx sent [facility] 

## 2017-06-18 NOTE — Telephone Encounter (Signed)
Patient's brother Freida Busmanllen calling to find out how much longer it will take to receive script that was supposed to be sent after patient's appointment yesterday?

## 2017-06-18 NOTE — Telephone Encounter (Signed)
Bryan West from [facility] called regarding a antibiotic that hasn't been received.

## 2017-08-03 ENCOUNTER — Ambulatory Visit (INDEPENDENT_AMBULATORY_CARE_PROVIDER_SITE_OTHER): Payer: Medicare Other | Admitting: Family Medicine

## 2017-08-03 ENCOUNTER — Encounter: Payer: Self-pay | Admitting: Family Medicine

## 2017-08-03 VITALS — BP 124/73 | HR 72 | Temp 97.7°F | Wt 181.0 lb

## 2017-08-03 DIAGNOSIS — B9789 Other viral agents as the cause of diseases classified elsewhere: Secondary | ICD-10-CM

## 2017-08-03 DIAGNOSIS — J069 Acute upper respiratory infection, unspecified: Secondary | ICD-10-CM

## 2017-08-03 NOTE — Progress Notes (Signed)
BP 124/73 (BP Location: Left Arm, Patient Position: Sitting, Cuff Size: Normal)   Pulse 72   Temp 97.7 F (36.5 C) (Oral)   Wt 181 lb (82.1 kg)   SpO2 98%   BMI 27.44 kg/m    Subjective:    Patient ID: Bryan West, male    DOB: 11/22/1960, 57 y.o.   MRN: [id number]  HPI: Bryan West is a 57 y.o. male  Chief Complaint  Patient presents with  . Cough    Ongoing for 6 days. Productive. Caregiver states it's worse at night time, patient is able to rest. Also states, he had the cold and then patient started with symptoms.  . Nasal Congestion  . Sore Throat   About a week of productive cough worse at night, congestion, sore throat. Taking nyquil and cherry cough drops at night with some relief. Brother who is with him today provides most of the hx, states his sxs seem some better today. Denies fevers, chills, aches, SOB, CP, wheezing. Multiple family members seem to be passing this around lately.   Relevant past medical, surgical, family and social history reviewed and updated as indicated. Interim medical history since our last visit reviewed. Allergies and medications reviewed and updated.  Review of Systems  Per HPI unless specifically indicated above     Objective:    BP 124/73 (BP Location: Left Arm, Patient Position: Sitting, Cuff Size: Normal)   Pulse 72   Temp 97.7 F (36.5 C) (Oral)   Wt 181 lb (82.1 kg)   SpO2 98%   BMI 27.44 kg/m   Wt Readings from Last 3 Encounters:  08/03/17 181 lb (82.1 kg)  06/17/17 184 lb 7 oz (83.7 kg)  02/26/17 178 lb 6 oz (80.9 kg)    Physical Exam  Constitutional: He appears well-developed and well-nourished.  HENT:  Head: Atraumatic.  Right Ear: External ear normal.  Left Ear: External ear normal.  Mouth/Throat: No oropharyngeal exudate.  Oropharynx and nasal mucosa mildly injected, no discharge present  Eyes: Pupils are equal, round, and reactive to light. Conjunctivae are normal.  Neck: Normal range of motion. Neck supple.    Cardiovascular: Normal rate and regular rhythm.  Pulmonary/Chest: Effort normal. No respiratory distress. He has no wheezes. He has no rales.  Musculoskeletal: Normal range of motion.  Lymphadenopathy:    He has no cervical adenopathy.  Neurological: He is alert.  Skin: Skin is warm and dry.  Psychiatric: He has a normal mood and affect. His behavior is normal.  Nursing note and vitals reviewed.   Results for orders placed or performed in visit on 02/26/17  Microscopic Examination  Result Value Ref Range   WBC, UA 0-5 0 - 5 /hpf   RBC, UA None seen 0 - 2 /hpf   Epithelial Cells (non renal) CANCELED    Bacteria, UA None seen None seen/Few  CBC with Differential/Platelet  Result Value Ref Range   WBC 7.2 3.4 - 10.8 x10E3/uL   RBC 4.91 4.14 - 5.80 x10E6/uL   Hemoglobin 15.2 13.0 - 17.7 g/dL   Hematocrit 82.945.2 56.237.5 - 51.0 %   MCV 92 79 - 97 fL   MCH 31.0 26.6 - 33.0 pg   MCHC 33.6 31.5 - 35.7 g/dL   RDW 13.014.3 86.512.3 - 78.415.4 %   Platelets 223 150 - 379 x10E3/uL   Neutrophils 48 Not Estab. %   Lymphs 42 Not Estab. %   Monocytes 8 Not Estab. %   Eos 2  Not Estab. %   Basos 0 Not Estab. %   Neutrophils Absolute 3.4 1.4 - 7.0 x10E3/uL   Lymphocytes Absolute 3.0 0.7 - 3.1 x10E3/uL   Monocytes Absolute 0.5 0.1 - 0.9 x10E3/uL   EOS (ABSOLUTE) 0.2 0.0 - 0.4 x10E3/uL   Basophils Absolute 0.0 0.0 - 0.2 x10E3/uL   Immature Granulocytes 0 Not Estab. %   Immature Grans (Abs) 0.0 0.0 - 0.1 x10E3/uL  Comprehensive metabolic panel  Result Value Ref Range   Glucose 91 65 - 99 mg/dL   BUN 11 6 - 24 mg/dL   Creatinine, Ser 7.82 0.76 - 1.27 mg/dL   GFR calc non Af Amer 79 >59 mL/min/1.73   GFR calc Af Amer 92 >59 mL/min/1.73   BUN/Creatinine Ratio 11 9 - 20   Sodium 136 134 - 144 mmol/L   Potassium 4.4 3.5 - 5.2 mmol/L   Chloride 95 (L) 96 - 106 mmol/L   CO2 27 20 - 29 mmol/L   Calcium 9.3 8.7 - 10.2 mg/dL   Total Protein 7.2 6.0 - 8.5 g/dL   Albumin 4.3 3.5 - 5.5 g/dL   Globulin, Total  2.9 1.5 - 4.5 g/dL   Albumin/Globulin Ratio 1.5 1.2 - 2.2   Bilirubin Total 1.1 0.0 - 1.2 mg/dL   Alkaline Phosphatase 86 39 - 117 IU/L   AST 7 0 - 40 IU/L   ALT 22 0 - 44 IU/L  Lipid Panel w/o Chol/HDL Ratio  Result Value Ref Range   Cholesterol, Total 212 (H) 100 - 199 mg/dL   Triglycerides 956 (H) 0 - 149 mg/dL   HDL 40 >21 mg/dL   VLDL Cholesterol Cal 64 (H) 5 - 40 mg/dL   LDL Calculated 308 (H) 0 - 99 mg/dL  Microalbumin, Urine Waived  Result Value Ref Range   Microalb, Ur Waived 30 (H) 0 - 19 mg/L   Creatinine, Urine Waived 200 10 - 300 mg/dL   Microalb/Creat Ratio <30 <30 mg/g  PSA  Result Value Ref Range   Prostate Specific Ag, Serum 1.4 0.0 - 4.0 ng/mL  TSH  Result Value Ref Range   TSH 1.160 0.450 - 4.500 uIU/mL  UA/M w/rflx Culture, Routine  Result Value Ref Range   Specific Gravity, UA 1.020 1.005 - 1.030   pH, UA 7.0 5.0 - 7.5   Color, UA Yellow Yellow   Appearance Ur Clear Clear   Leukocytes, UA Trace (A) Negative   Protein, UA Negative Negative/Trace   Glucose, UA Negative Negative   Ketones, UA Negative Negative   RBC, UA Negative Negative   Bilirubin, UA Negative Negative   Urobilinogen, Ur 1.0 0.2 - 1.0 mg/dL   Nitrite, UA Negative Negative   Microscopic Examination See below:   HIV antibody  Result Value Ref Range   HIV Screen 4th Generation wRfx Non Reactive Non Reactive      Assessment & Plan:   Problem List Items Addressed This Visit    None    Visit Diagnoses    Viral URI with cough    -  Primary   Sxs appear to be resolving with conservative mgmt. Cont OTC medications and supportive care as discussed, return precautions reviewed. Call if worsening       Follow up plan: Return if symptoms worsen or fail to improve.

## 2017-08-04 NOTE — Patient Instructions (Signed)
Follow up as needed

## 2017-08-23 DIAGNOSIS — L570 Actinic keratosis: Secondary | ICD-10-CM | POA: Diagnosis not present

## 2017-08-23 DIAGNOSIS — L578 Other skin changes due to chronic exposure to nonionizing radiation: Secondary | ICD-10-CM | POA: Diagnosis not present

## 2017-08-23 DIAGNOSIS — L400 Psoriasis vulgaris: Secondary | ICD-10-CM | POA: Diagnosis not present

## 2017-08-23 DIAGNOSIS — L821 Other seborrheic keratosis: Secondary | ICD-10-CM | POA: Diagnosis not present

## 2017-08-27 ENCOUNTER — Ambulatory Visit: Payer: Medicare Other | Admitting: Family Medicine

## 2017-12-14 ENCOUNTER — Other Ambulatory Visit: Payer: Self-pay | Admitting: Family Medicine

## 2017-12-14 NOTE — Telephone Encounter (Signed)
Needs appointment

## 2017-12-14 NOTE — Telephone Encounter (Signed)
Scheduled for 9/10 @ 9:30   Just FYI

## 2017-12-21 ENCOUNTER — Ambulatory Visit: Payer: Medicare Other | Admitting: Family Medicine

## 2017-12-23 ENCOUNTER — Encounter: Payer: Self-pay | Admitting: Family Medicine

## 2017-12-23 ENCOUNTER — Ambulatory Visit (INDEPENDENT_AMBULATORY_CARE_PROVIDER_SITE_OTHER): Payer: Medicare Other | Admitting: Family Medicine

## 2017-12-23 VITALS — BP 137/74 | HR 58 | Temp 98.4°F | Ht 68.0 in | Wt 175.7 lb

## 2017-12-23 DIAGNOSIS — I10 Essential (primary) hypertension: Secondary | ICD-10-CM | POA: Diagnosis not present

## 2017-12-23 DIAGNOSIS — E782 Mixed hyperlipidemia: Secondary | ICD-10-CM

## 2017-12-23 DIAGNOSIS — Z23 Encounter for immunization: Secondary | ICD-10-CM | POA: Diagnosis not present

## 2017-12-23 DIAGNOSIS — D72820 Lymphocytosis (symptomatic): Secondary | ICD-10-CM | POA: Diagnosis not present

## 2017-12-23 MED ORDER — LOSARTAN POTASSIUM-HCTZ 50-12.5 MG PO TABS: 1.0000 | ORAL_TABLET | Freq: Every day | ORAL | 1 refills | Status: DC

## 2017-12-23 NOTE — Progress Notes (Signed)
BP 137/74   Pulse (!) 58   Temp 98.4 F (36.9 C) (Oral)   Ht 5\' 8"  (1.727 m)   Wt 175 lb 11.2 oz (79.7 kg)   SpO2 98%   BMI 26.72 kg/m    Subjective:    Patient ID: Bryan West, male    DOB: January 23, 1961, 57 y.o.   MRN: [id number]  HPI: Bryan West is a 57 y.o. male  Chief Complaint  Patient presents with  . Hypertension   HYPERTENSION / HYPERLIPIDEMIA Satisfied with current treatment? yes Duration of hypertension: chronic BP monitoring frequency: not checking BP medication side effects: no Past BP meds: losartan-HCTZ Duration of hyperlipidemia: chronic Cholesterol medication side effects: no Cholesterol supplements: none Past cholesterol medications: none Medication compliance: excellent compliance Aspirin: no Recent stressors: no Recurrent headaches: no Visual changes: no Palpitations: no Dyspnea: no Chest pain: no Lower extremity edema: no Dizzy/lightheaded: no   Relevant past medical, surgical, family and social history reviewed and updated as indicated. Interim medical history since our last visit reviewed. Allergies and medications reviewed and updated.  Review of Systems  Constitutional: Negative.   Respiratory: Negative.   Cardiovascular: Negative.   Psychiatric/Behavioral: Negative.     Per HPI unless specifically indicated above     Objective:    BP 137/74   Pulse (!) 58   Temp 98.4 F (36.9 C) (Oral)   Ht 5\' 8"  (1.727 m)   Wt 175 lb 11.2 oz (79.7 kg)   SpO2 98%   BMI 26.72 kg/m   Wt Readings from Last 3 Encounters:  12/23/17 175 lb 11.2 oz (79.7 kg)  08/03/17 181 lb (82.1 kg)  06/17/17 184 lb 7 oz (83.7 kg)    Physical Exam  Constitutional: He is oriented to person, place, and time. He appears well-developed and well-nourished. No distress.  HENT:  Head: Normocephalic and atraumatic.  Right Ear: Hearing normal.  Left Ear: Hearing normal.  Nose: Nose normal.  Eyes: Conjunctivae and lids are normal. Right eye exhibits no  discharge. Left eye exhibits no discharge. No scleral icterus.  Cardiovascular: Normal rate, regular rhythm, normal heart sounds and intact distal pulses. Exam reveals no gallop and no friction rub.  No murmur heard. Pulmonary/Chest: Effort normal and breath sounds normal. No stridor. No respiratory distress. He has no wheezes. He has no rales. He exhibits no tenderness.  Musculoskeletal: Normal range of motion.  Neurological: He is alert and oriented to person, place, and time.  Skin: Skin is warm, dry and intact. Capillary refill takes less than 2 seconds. No rash noted. He is not diaphoretic. No erythema. No pallor.  Psychiatric: He has a normal mood and affect. His speech is normal and behavior is normal. Judgment and thought content normal. Cognition and memory are normal.  Nursing note and vitals reviewed.   Results for orders placed or performed in visit on 02/26/17  Microscopic Examination  Result Value Ref Range   WBC, UA 0-5 0 - 5 /hpf   RBC, UA None seen 0 - 2 /hpf   Epithelial Cells (non renal) CANCELED    Bacteria, UA None seen None seen/Few  CBC with Differential/Platelet  Result Value Ref Range   WBC 7.2 3.4 - 10.8 x10E3/uL   RBC 4.91 4.14 - 5.80 x10E6/uL   Hemoglobin 15.2 13.0 - 17.7 g/dL   Hematocrit 40.9 81.1 - 51.0 %   MCV 92 79 - 97 fL   MCH 31.0 26.6 - 33.0 pg   MCHC 33.6 31.5 -  35.7 g/dL   RDW 54.0 98.1 - 19.1 %   Platelets 223 150 - 379 x10E3/uL   Neutrophils 48 Not Estab. %   Lymphs 42 Not Estab. %   Monocytes 8 Not Estab. %   Eos 2 Not Estab. %   Basos 0 Not Estab. %   Neutrophils Absolute 3.4 1.4 - 7.0 x10E3/uL   Lymphocytes Absolute 3.0 0.7 - 3.1 x10E3/uL   Monocytes Absolute 0.5 0.1 - 0.9 x10E3/uL   EOS (ABSOLUTE) 0.2 0.0 - 0.4 x10E3/uL   Basophils Absolute 0.0 0.0 - 0.2 x10E3/uL   Immature Granulocytes 0 Not Estab. %   Immature Grans (Abs) 0.0 0.0 - 0.1 x10E3/uL  Comprehensive metabolic panel  Result Value Ref Range   Glucose 91 65 - 99 mg/dL    BUN 11 6 - 24 mg/dL   Creatinine, Ser 4.78 0.76 - 1.27 mg/dL   GFR calc non Af Amer 79 >59 mL/min/1.73   GFR calc Af Amer 92 >59 mL/min/1.73   BUN/Creatinine Ratio 11 9 - 20   Sodium 136 134 - 144 mmol/L   Potassium 4.4 3.5 - 5.2 mmol/L   Chloride 95 (L) 96 - 106 mmol/L   CO2 27 20 - 29 mmol/L   Calcium 9.3 8.7 - 10.2 mg/dL   Total Protein 7.2 6.0 - 8.5 g/dL   Albumin 4.3 3.5 - 5.5 g/dL   Globulin, Total 2.9 1.5 - 4.5 g/dL   Albumin/Globulin Ratio 1.5 1.2 - 2.2   Bilirubin Total 1.1 0.0 - 1.2 mg/dL   Alkaline Phosphatase 86 39 - 117 IU/L   AST 7 0 - 40 IU/L   ALT 22 0 - 44 IU/L  Lipid Panel w/o Chol/HDL Ratio  Result Value Ref Range   Cholesterol, Total 212 (H) 100 - 199 mg/dL   Triglycerides 295 (H) 0 - 149 mg/dL   HDL 40 >62 mg/dL   VLDL Cholesterol Cal 64 (H) 5 - 40 mg/dL   LDL Calculated 130 (H) 0 - 99 mg/dL  Microalbumin, Urine Waived  Result Value Ref Range   Microalb, Ur Waived 30 (H) 0 - 19 mg/L   Creatinine, Urine Waived 200 10 - 300 mg/dL   Microalb/Creat Ratio <30 <30 mg/g  PSA  Result Value Ref Range   Prostate Specific Ag, Serum 1.4 0.0 - 4.0 ng/mL  TSH  Result Value Ref Range   TSH 1.160 0.450 - 4.500 uIU/mL  UA/M w/rflx Culture, Routine  Result Value Ref Range   Specific Gravity, UA 1.020 1.005 - 1.030   pH, UA 7.0 5.0 - 7.5   Color, UA Yellow Yellow   Appearance Ur Clear Clear   Leukocytes, UA Trace (A) Negative   Protein, UA Negative Negative/Trace   Glucose, UA Negative Negative   Ketones, UA Negative Negative   RBC, UA Negative Negative   Bilirubin, UA Negative Negative   Urobilinogen, Ur 1.0 0.2 - 1.0 mg/dL   Nitrite, UA Negative Negative   Microscopic Examination See below:   HIV antibody  Result Value Ref Range   HIV Screen 4th Generation wRfx Non Reactive Non Reactive      Assessment & Plan:   Problem List Items Addressed This Visit      Cardiovascular and Mediastinum   Hypertension - Primary    Under good control on current  regimen. Continue current regimen. Continue to monitor. Call with any concerns. Refills given.        Relevant Medications   losartan-hydrochlorothiazide (HYZAAR) 50-12.5 MG tablet  Other Relevant Orders   Comprehensive metabolic panel     Other   Hyperlipidemia    Not on medicine. Rechecking levels today. Call with any concerns.       Relevant Medications   losartan-hydrochlorothiazide (HYZAAR) 50-12.5 MG tablet   Other Relevant Orders   Comprehensive metabolic panel   Lipid Panel w/o Chol/HDL Ratio   Lymphocytosis    Rechecking levels today. Call with any concerns.       Relevant Orders   CBC with Differential/Platelet   Comprehensive metabolic panel    Other Visit Diagnoses    Need for influenza vaccination       Flu shot given today.   Relevant Orders   Flu Vaccine QUAD 36+ mos IM (Completed)       Follow up plan: Return in about 6 months (around 06/23/2018) for Wellness/Follow up.

## 2017-12-23 NOTE — Assessment & Plan Note (Signed)
Rechecking levels today. Call with any concerns.  

## 2017-12-23 NOTE — Patient Instructions (Signed)

## 2017-12-23 NOTE — Assessment & Plan Note (Signed)
Under good control on current regimen. Continue current regimen. Continue to monitor. Call with any concerns. Refills given.   

## 2017-12-23 NOTE — Assessment & Plan Note (Signed)
Not on medicine. Rechecking levels today. Call with any concerns.

## 2017-12-24 ENCOUNTER — Encounter: Payer: Self-pay | Admitting: Family Medicine

## 2017-12-24 LAB — COMPREHENSIVE METABOLIC PANEL
A/G RATIO: 1.4 (ref 1.2–2.2)
ALT: 11 IU/L (ref 0–44)
AST: 7 IU/L (ref 0–40)
Albumin: 4.2 g/dL (ref 3.5–5.5)
Alkaline Phosphatase: 82 IU/L (ref 39–117)
BILIRUBIN TOTAL: 1 mg/dL (ref 0.0–1.2)
BUN/Creatinine Ratio: 14 (ref 9–20)
BUN: 13 mg/dL (ref 6–24)
CALCIUM: 9.5 mg/dL (ref 8.7–10.2)
CHLORIDE: 100 mmol/L (ref 96–106)
CO2: 26 mmol/L (ref 20–29)
Creatinine, Ser: 0.9 mg/dL (ref 0.76–1.27)
GFR, EST AFRICAN AMERICAN: 108 mL/min/{1.73_m2} (ref >59)
GFR, EST NON AFRICAN AMERICAN: 94 mL/min/{1.73_m2} (ref >59)
GLOBULIN, TOTAL: 2.9 g/dL (ref 1.5–4.5)
Glucose: 95 mg/dL (ref 65–99)
POTASSIUM: 4.1 mmol/L (ref 3.5–5.2)
Sodium: 141 mmol/L (ref 134–144)
Total Protein: 7.1 g/dL (ref 6.0–8.5)

## 2017-12-24 LAB — LIPID PANEL W/O CHOL/HDL RATIO
Cholesterol, Total: 201 mg/dL — ABNORMAL HIGH (ref 100–199)
HDL: 45 mg/dL (ref >39)
LDL Calculated: 123 mg/dL — ABNORMAL HIGH (ref 0–99)
TRIGLYCERIDES: 165 mg/dL — AB (ref 0–149)
VLDL Cholesterol Cal: 33 mg/dL (ref 5–40)

## 2017-12-24 LAB — CBC WITH DIFFERENTIAL/PLATELET
BASOS: 1 %
Basophils Absolute: 0.1 10*3/uL (ref 0.0–0.2)
EOS (ABSOLUTE): 0.1 10*3/uL (ref 0.0–0.4)
EOS: 2 %
Hematocrit: 43.7 % (ref 37.5–51.0)
Hemoglobin: 15.1 g/dL (ref 13.0–17.7)
IMMATURE GRANULOCYTES: 0 %
Immature Grans (Abs): 0 10*3/uL (ref 0.0–0.1)
Lymphocytes Absolute: 2.8 10*3/uL (ref 0.7–3.1)
Lymphs: 41 %
MCH: 31.5 pg (ref 26.6–33.0)
MCHC: 34.6 g/dL (ref 31.5–35.7)
MCV: 91 fL (ref 79–97)
MONOS ABS: 0.5 10*3/uL (ref 0.1–0.9)
Monocytes: 8 %
NEUTROS PCT: 48 %
Neutrophils Absolute: 3.2 10*3/uL (ref 1.4–7.0)
PLATELETS: 205 10*3/uL (ref 150–450)
RBC: 4.8 x10E6/uL (ref 4.14–5.80)
RDW: 13.1 % (ref 12.3–15.4)
WBC: 6.7 10*3/uL (ref 3.4–10.8)

## 2018-03-07 ENCOUNTER — Ambulatory Visit: Payer: Self-pay | Admitting: *Deleted

## 2018-03-07 ENCOUNTER — Encounter: Payer: Self-pay | Admitting: Family Medicine

## 2018-03-07 ENCOUNTER — Ambulatory Visit (INDEPENDENT_AMBULATORY_CARE_PROVIDER_SITE_OTHER): Payer: Medicare Other | Admitting: Family Medicine

## 2018-03-07 VITALS — BP 107/69 | HR 70 | Temp 97.4°F | Ht 68.0 in | Wt 175.4 lb

## 2018-03-07 DIAGNOSIS — K5792 Diverticulitis of intestine, part unspecified, without perforation or abscess without bleeding: Secondary | ICD-10-CM

## 2018-03-07 DIAGNOSIS — R509 Fever, unspecified: Secondary | ICD-10-CM

## 2018-03-07 LAB — CBC WITH DIFFERENTIAL/PLATELET
Hematocrit: 37.6 % (ref 37.5–51.0)
Hemoglobin: 13.7 g/dL (ref 13.0–17.7)
Lymphocytes Absolute: 1.7 10*3/uL (ref 0.7–3.1)
Lymphs: 22 %
MCH: 32.9 pg (ref 26.6–33.0)
MCHC: 36.4 g/dL — ABNORMAL HIGH (ref 31.5–35.7)
MCV: 90 fL (ref 79–97)
MID (Absolute): 0.6 10*3/uL (ref 0.1–1.6)
MID: 8 %
Neutrophils Absolute: 5.2 10*3/uL (ref 1.4–7.0)
Neutrophils: 70 %
Platelets: 169 10*3/uL (ref 150–450)
RBC: 4.17 x10E6/uL (ref 4.14–5.80)
RDW: 13.4 % (ref 12.3–15.4)
WBC: 7.5 10*3/uL (ref 3.4–10.8)

## 2018-03-07 LAB — UA/M W/RFLX CULTURE, ROUTINE
Bilirubin, UA: NEGATIVE
Glucose, UA: NEGATIVE
Ketones, UA: NEGATIVE
Nitrite, UA: NEGATIVE
RBC, UA: NEGATIVE
Specific Gravity, UA: 1.015 (ref 1.005–1.030)
Urobilinogen, Ur: 1 mg/dL (ref 0.2–1.0)
pH, UA: 5.5 (ref 5.0–7.5)

## 2018-03-07 LAB — MICROSCOPIC EXAMINATION

## 2018-03-07 LAB — VERITOR FLU A/B WAIVED
Influenza A: NEGATIVE
Influenza B: NEGATIVE

## 2018-03-07 MED ORDER — CIPROFLOXACIN HCL 500 MG PO TABS: 500.0000 mg | ORAL_TABLET | Freq: Two times a day (BID) | ORAL | 0 refills | Status: DC

## 2018-03-07 MED ORDER — METRONIDAZOLE 500 MG PO TABS: 500.0000 mg | ORAL_TABLET | Freq: Two times a day (BID) | ORAL | 0 refills | Status: DC

## 2018-03-07 NOTE — Progress Notes (Signed)
BP 107/69 (BP Location: Right Arm, Patient Position: Sitting, Cuff Size: Normal)   Pulse 70   Temp (!) 97.4 F (36.3 C)   Ht 5\' 8"  (1.727 m)   Wt 175 lb 7 oz (79.6 kg)   SpO2 96%   BMI 26.68 kg/m    Subjective:    Patient ID: Bryan West, male    DOB: 08-Apr-1961, 57 y.o.   MRN: [id number]  HPI: Bryan West is a 57 y.o. male  Chief Complaint  Patient presents with  . Fever    chills, lft side abdominal pain, diarrhea and cough   UPPER RESPIRATORY TRACT INFECTION Duration: since 11/23 Worst symptom: Fever: yes Cough: yes Shortness of breath: yes Wheezing: no Chest pain: yes, with cough Chest tightness: yes Chest congestion: yes Nasal congestion: yes Runny nose: yes Post nasal drip: yes Sneezing: no Sore throat: no Swollen glands: no Sinus pressure: no Headache: yes Face pain: no Toothache: no Ear pain: no  Ear pressure: no  Eyes red/itching:no Eye drainage/crusting: no  Vomiting: no  Diarrhea: yes 2x today Abdominal pain on the LLQ Rash: yes- fever blisters on mouth Fatigue: yes Sick contacts: no Strep contacts: no  Context: stable Recurrent sinusitis: no Relief with OTC cold/cough medications: no  Treatments attempted: tylenol    Relevant past medical, surgical, family and social history reviewed and updated as indicated. Interim medical history since our last visit reviewed. Allergies and medications reviewed and updated.  Review of Systems  Constitutional: Positive for appetite change, chills, diaphoresis, fatigue and fever. Negative for activity change and unexpected weight change.  HENT: Positive for congestion, postnasal drip and rhinorrhea. Negative for dental problem, drooling, ear discharge, ear pain, facial swelling, hearing loss, mouth sores, nosebleeds, sinus pressure, sinus pain, sneezing, sore throat, tinnitus, trouble swallowing and voice change.   Eyes: Negative.   Respiratory: Positive for cough, chest tightness and shortness of  breath. Negative for apnea, choking, wheezing and stridor.   Cardiovascular: Negative.   Gastrointestinal: Positive for abdominal pain and diarrhea. Negative for abdominal distention, anal bleeding, blood in stool, constipation, nausea, rectal pain and vomiting.  Musculoskeletal: Negative.   Skin: Negative.   Neurological: Negative.   Psychiatric/Behavioral: Negative.     Per HPI unless specifically indicated above     Objective:    BP 107/69 (BP Location: Right Arm, Patient Position: Sitting, Cuff Size: Normal)   Pulse 70   Temp (!) 97.4 F (36.3 C)   Ht 5\' 8"  (1.727 m)   Wt 175 lb 7 oz (79.6 kg)   SpO2 96%   BMI 26.68 kg/m   Wt Readings from Last 3 Encounters:  03/07/18 175 lb 7 oz (79.6 kg)  12/23/17 175 lb 11.2 oz (79.7 kg)  08/03/17 181 lb (82.1 kg)    Physical Exam  Constitutional: He is oriented to person, place, and time. He appears well-developed and well-nourished. No distress.  HENT:  Head: Normocephalic and atraumatic.  Right Ear: Hearing and external ear normal.  Left Ear: Hearing and external ear normal.  Nose: Nose normal.  Mouth/Throat: Oropharynx is clear and moist. No oropharyngeal exudate.  Eyes: Pupils are equal, round, and reactive to light. Conjunctivae, EOM and lids are normal. Right eye exhibits no discharge. Left eye exhibits no discharge. No scleral icterus.  Neck: Normal range of motion. Neck supple. No JVD present. No tracheal deviation present. No thyromegaly present.  Cardiovascular: Normal rate, regular rhythm, normal heart sounds and intact distal pulses. Exam reveals no gallop and  no friction rub.  No murmur heard. Pulmonary/Chest: Effort normal and breath sounds normal. No stridor. No respiratory distress. He has no wheezes. He has no rales. He exhibits no tenderness.  Abdominal: Soft. Bowel sounds are normal. He exhibits no distension and no mass. There is tenderness (LLQ). There is no rebound and no guarding. No hernia.  Musculoskeletal:  Normal range of motion.  Lymphadenopathy:    He has no cervical adenopathy.  Neurological: He is alert and oriented to person, place, and time.  Skin: Skin is warm, dry and intact. Capillary refill takes less than 2 seconds. No rash noted. He is not diaphoretic. No erythema. No pallor.  Psychiatric: He has a normal mood and affect. His speech is normal and behavior is normal. Judgment and thought content normal. Cognition and memory are normal.  Nursing note and vitals reviewed.   Results for orders placed or performed in visit on 12/23/17  CBC with Differential/Platelet  Result Value Ref Range   WBC 6.7 3.4 - 10.8 x10E3/uL   RBC 4.80 4.14 - 5.80 x10E6/uL   Hemoglobin 15.1 13.0 - 17.7 g/dL   Hematocrit 16.143.7 09.637.5 - 51.0 %   MCV 91 79 - 97 fL   MCH 31.5 26.6 - 33.0 pg   MCHC 34.6 31.5 - 35.7 g/dL   RDW 04.513.1 40.912.3 - 81.115.4 %   Platelets 205 150 - 450 x10E3/uL   Neutrophils 48 Not Estab. %   Lymphs 41 Not Estab. %   Monocytes 8 Not Estab. %   Eos 2 Not Estab. %   Basos 1 Not Estab. %   Neutrophils Absolute 3.2 1.4 - 7.0 x10E3/uL   Lymphocytes Absolute 2.8 0.7 - 3.1 x10E3/uL   Monocytes Absolute 0.5 0.1 - 0.9 x10E3/uL   EOS (ABSOLUTE) 0.1 0.0 - 0.4 x10E3/uL   Basophils Absolute 0.1 0.0 - 0.2 x10E3/uL   Immature Granulocytes 0 Not Estab. %   Immature Grans (Abs) 0.0 0.0 - 0.1 x10E3/uL  Comprehensive metabolic panel  Result Value Ref Range   Glucose 95 65 - 99 mg/dL   BUN 13 6 - 24 mg/dL   Creatinine, Ser 9.140.90 0.76 - 1.27 mg/dL   GFR calc non Af Amer 94 >59 mL/min/1.73   GFR calc Af Amer 108 >59 mL/min/1.73   BUN/Creatinine Ratio 14 9 - 20   Sodium 141 134 - 144 mmol/L   Potassium 4.1 3.5 - 5.2 mmol/L   Chloride 100 96 - 106 mmol/L   CO2 26 20 - 29 mmol/L   Calcium 9.5 8.7 - 10.2 mg/dL   Total Protein 7.1 6.0 - 8.5 g/dL   Albumin 4.2 3.5 - 5.5 g/dL   Globulin, Total 2.9 1.5 - 4.5 g/dL   Albumin/Globulin Ratio 1.4 1.2 - 2.2   Bilirubin Total 1.0 0.0 - 1.2 mg/dL   Alkaline  Phosphatase 82 39 - 117 IU/L   AST 7 0 - 40 IU/L   ALT 11 0 - 44 IU/L  Lipid Panel w/o Chol/HDL Ratio  Result Value Ref Range   Cholesterol, Total 201 (H) 100 - 199 mg/dL   Triglycerides 782165 (H) 0 - 149 mg/dL   HDL 45 >95>39 mg/dL   VLDL Cholesterol Cal 33 5 - 40 mg/dL   LDL Calculated 621123 (H) 0 - 99 mg/dL      Assessment & Plan:   Problem List Items Addressed This Visit    None    Visit Diagnoses    Diverticulitis    -  Primary  Will treat with cipro and flagyl, let us know if not getting better or getting worse.    Fever, unspecified fever cause       Flu negative. Trace leuks on UA, Given tenderness in LLQ, will treat for diverticulitis. Call with any concerns.    Relevant Orders   Veritor Flu A/B Waived   CBC With Differential/Platelet   UA/M w/rflx Culture, Routine       Follow up plan: Return if symptoms worsen or fail to improve.

## 2018-03-07 NOTE — Telephone Encounter (Signed)
Pt's brother, Bryan West, called stating that the pt was having chills, dizziness, and back pain which started 03/05/18; Bryan West says the pt did a lot of coughing; the pt received nyquil and ibuprofen on Saturday and Sunday nights; Bryan West is most concerned with the pt\'s cough and dizziness; the pt has a poor appetite but drank 2 - 16 oz bottles of water;  Bryan West also reports that the pt has several fever blisters on his upper and lower lips; recommendations made per nurse triage protocol; he normally sees Rachel Lane but she has no availability; spoke with JaLisa in regards to scheduling pt; pt\'s brother offered and accepted appointment on his behalf with Dr Megan Johnson, [facility], 03/08/18 at 0845; will route to office for notification.    Reason for Disposition . [1] Continuous (nonstop) coughing interferes with work or school AND [2] no improvement using cough treatment per protocol  Answer Assessment - Initial Assessment Questions 1. ONSET: "When did the cough begin?"      11 /23/19 2. SEVERITY: "How bad is the cough today?"      Mild to severe; worse at night  3. RESPIRATORY DISTRESS: "Describe your breathing."      Breathing fine 4. FEVER: "Do you have a fever?" If so, ask: "What is your temperature, how was it measured, and when did it start?"     Not sure 5. HEMOPTYSIS: "Are you coughing up any blood?" If so ask: "How much?" (flecks, streaks, tablespoons, etc.)     no 6. TREATMENT: "What have you done so far to treat the cough?" (e.g., meds, fluids, humidifier)     nyquil 7. CARDIAC HISTORY: "Do you have any history of heart disease?" (e.g., heart attack, congestive heart failure)      High blood pressure 8. LUNG HISTORY: "Do you have any history of lung disease?"  (e.g., pulmonary embolus, asthma, emphysema)     no 9. PE RISK FACTORS: "Do you have a history of blood clots?" (or: recent major surgery, recent prolonged travel, bedridden)    no 10. OTHER SYMPTOMS: "Do you have any other  symptoms? (e.g., runny nose, wheezing, chest pain)       Chills, dizziness, back pain all the time 11. PREGNANCY: "Is there any chance you are pregnant?" "When was your last menstrual period?"      n/a 12. TRAVEL: "Have you traveled out of the country in the last month?" (e.g., travel history, exposures)       no  Protocols used: COUGH - ACUTE NON-PRODUCTIVE-A-AH

## 2018-03-07 NOTE — Telephone Encounter (Signed)
Please get him in ASAP- sounds like the flu

## 2018-03-08 ENCOUNTER — Ambulatory Visit: Payer: Self-pay | Admitting: Family Medicine

## 2018-03-14 ENCOUNTER — Telehealth: Payer: Self-pay

## 2018-03-14 NOTE — Telephone Encounter (Signed)
Fax from pharmacy.  Losartan and HCTZ needs to be split into 2 separate Rx's. Combination on back order.

## 2018-03-14 NOTE — Telephone Encounter (Signed)
OK to give verbal.

## 2018-03-14 NOTE — Telephone Encounter (Signed)
Verbal order given  

## 2018-03-16 ENCOUNTER — Other Ambulatory Visit: Payer: Self-pay | Admitting: Nurse Practitioner

## 2018-03-16 ENCOUNTER — Telehealth: Payer: Self-pay | Admitting: Family Medicine

## 2018-03-16 MED ORDER — LOSARTAN POTASSIUM 50 MG PO TABS: 50.0000 mg | ORAL_TABLET | Freq: Every day | ORAL | 3 refills | Status: DC

## 2018-03-16 MED ORDER — HYDROCHLOROTHIAZIDE 12.5 MG PO CAPS: 12.5000 mg | ORAL_CAPSULE | Freq: Every day | ORAL | 3 refills | Status: DC

## 2018-03-16 MED ORDER — TELMISARTAN-HCTZ 40-12.5 MG PO TABS: 1.0000 | ORAL_TABLET | Freq: Every day | ORAL | 2 refills | Status: DC

## 2018-03-16 NOTE — Telephone Encounter (Signed)
I completed this and spoke with pharmacy.  They spoke to caregiver.  This is complete.

## 2018-03-16 NOTE — Telephone Encounter (Signed)
Received call from Vernona RiegerLaura with [facility] regarding the script Dr Laural BenesJohnson called in for patients telmesartan HCTZ , his insurance will not cover this formula so they are needing someone to possibly change it to Valsartan HCTZ and call in ASAP as patients brother Freida Busmanllen has been waiting for this med.  Can someone please help with this?    Thank You

## 2018-03-16 NOTE — Telephone Encounter (Signed)
Bryan West from [facility] called in stating that they sent over a fax Friday and hasn't received a response. States that they can no longer fill losartan combo 50-12.5 or 100 due to recall and caretaker doesn't want to split the pills due to pt getting them mixed up. Pharmacy would like to know if you can prescribe olmesartan or telmisartan which comes in a combo form. Please advise.

## 2018-03-16 NOTE — Telephone Encounter (Signed)
Rx for telmesartan-hctz sent to his pharmacy, but we need to see him in about a month to make sure it is still working and not dropping his BP too low. Thanks!

## 2018-03-16 NOTE — Progress Notes (Signed)
Patient pharmacy notified clinic that Telmisartan-HCTZ not covered by patient insurance and they are requesting change to Valsartan-HCTZ.  Spoke with pharmacist and only doses available for Valsartan-HCTZ are 160-25MG , which may decrease BP too much for this patient.  Pharmacist discussed with caregiver and they are willing to take two separate pills at this time, until recall complete.  Will order Losartan 50 MG and HCTZ 12.5 MG with 90 day supply and 3 refill, as separate doses at this time.  Was previously on combo pill Losartan-HCTZ.

## 2018-06-23 ENCOUNTER — Other Ambulatory Visit: Payer: Self-pay

## 2018-06-23 ENCOUNTER — Encounter: Payer: Self-pay | Admitting: Family Medicine

## 2018-06-23 ENCOUNTER — Ambulatory Visit (INDEPENDENT_AMBULATORY_CARE_PROVIDER_SITE_OTHER): Payer: Medicare Other | Admitting: Family Medicine

## 2018-06-23 VITALS — BP 137/89 | HR 55 | Temp 97.7°F | Wt 178.0 lb

## 2018-06-23 DIAGNOSIS — D72829 Elevated white blood cell count, unspecified: Secondary | ICD-10-CM | POA: Diagnosis not present

## 2018-06-23 DIAGNOSIS — D72820 Lymphocytosis (symptomatic): Secondary | ICD-10-CM

## 2018-06-23 DIAGNOSIS — E782 Mixed hyperlipidemia: Secondary | ICD-10-CM

## 2018-06-23 DIAGNOSIS — I10 Essential (primary) hypertension: Secondary | ICD-10-CM | POA: Diagnosis not present

## 2018-06-23 DIAGNOSIS — E785 Hyperlipidemia, unspecified: Secondary | ICD-10-CM | POA: Diagnosis not present

## 2018-06-23 MED ORDER — HYDROCHLOROTHIAZIDE 12.5 MG PO CAPS: 12.5000 mg | ORAL_CAPSULE | Freq: Every day | ORAL | 2 refills | Status: DC

## 2018-06-23 MED ORDER — LOSARTAN POTASSIUM 50 MG PO TABS: 50.0000 mg | ORAL_TABLET | Freq: Every day | ORAL | 2 refills | Status: DC

## 2018-06-23 NOTE — Assessment & Plan Note (Signed)
Under good control on current regimen. Continue current regimen. Continue to monitor. Call with any concerns. Refills given. Labs drawn today.   

## 2018-06-23 NOTE — Assessment & Plan Note (Signed)
Rechecking labs today. Await results. Call with any concerns.  

## 2018-06-23 NOTE — Progress Notes (Signed)
BP 137/89   Pulse (!) 55   Temp 97.7 F (36.5 C) (Oral)   Wt 178 lb (80.7 kg)   SpO2 98%   BMI 27.06 kg/m    Subjective:    Patient ID: Bryan West, male    DOB: Sep 10, 1960, 58 y.o.   MRN: [id number]  HPI: Bryan West is a 58 y.o. male  Chief Complaint  Patient presents with  . Hypertension    45m f/u  . Hyperlipidemia   HYPERTENSION / HYPERLIPIDEMIA Satisfied with current treatment? yes Duration of hypertension: chronic BP monitoring frequency: not checking BP medication side effects: no Past BP meds: losartan, HCTZ Duration of hyperlipidemia: chronic Cholesterol medication side effects: not on anything Cholesterol supplements: none Past cholesterol medications: none Medication compliance: excellent compliance Aspirin: no Recent stressors: no Recurrent headaches: no Visual changes: no Palpitations: no Dyspnea: no Chest pain: no Lower extremity edema: no Dizzy/lightheaded: no   Relevant past medical, surgical, family and social history reviewed and updated as indicated. Interim medical history since our last visit reviewed. Allergies and medications reviewed and updated.  Review of Systems  Constitutional: Negative.   Respiratory: Negative.   Cardiovascular: Negative.   Psychiatric/Behavioral: Negative.     Per HPI unless specifically indicated above     Objective:    BP 137/89   Pulse (!) 55   Temp 97.7 F (36.5 C) (Oral)   Wt 178 lb (80.7 kg)   SpO2 98%   BMI 27.06 kg/m   Wt Readings from Last 3 Encounters:  06/23/18 178 lb (80.7 kg)  03/07/18 175 lb 7 oz (79.6 kg)  12/23/17 175 lb 11.2 oz (79.7 kg)    Physical Exam Vitals signs and nursing note reviewed.  Constitutional:      General: He is not in acute distress.    Appearance: Normal appearance. He is not ill-appearing, toxic-appearing or diaphoretic.  HENT:     Head: Normocephalic and atraumatic.     Right Ear: External ear normal.     Left Ear: External ear normal.     Nose: Nose  normal.     Mouth/Throat:     Mouth: Mucous membranes are moist.     Pharynx: Oropharynx is clear.  Eyes:     General: No scleral icterus.       Right eye: No discharge.        Left eye: No discharge.     Extraocular Movements: Extraocular movements intact.     Conjunctiva/sclera: Conjunctivae normal.     Pupils: Pupils are equal, round, and reactive to light.  Neck:     Musculoskeletal: Normal range of motion and neck supple.  Cardiovascular:     Rate and Rhythm: Normal rate and regular rhythm.     Pulses: Normal pulses.     Heart sounds: Normal heart sounds. No murmur. No friction rub. No gallop.   Pulmonary:     Effort: Pulmonary effort is normal. No respiratory distress.     Breath sounds: Normal breath sounds. No stridor. No wheezing, rhonchi or rales.  Chest:     Chest wall: No tenderness.  Musculoskeletal: Normal range of motion.  Skin:    General: Skin is warm and dry.     Capillary Refill: Capillary refill takes less than 2 seconds.     Coloration: Skin is not jaundiced or pale.     Findings: No bruising, erythema, lesion or rash.  Neurological:     General: No focal deficit present.  Mental Status: He is alert and oriented to person, place, and time. Mental status is at baseline.  Psychiatric:        Mood and Affect: Mood normal.        Behavior: Behavior normal.        Thought Content: Thought content normal.        Judgment: Judgment normal.     Results for orders placed or performed in visit on 03/07/18  Microscopic Examination  Result Value Ref Range   WBC, UA 0-5 0 - 5 /hpf   RBC, UA 0-2 0 - 2 /hpf   Epithelial Cells (non renal) CANCELED    Bacteria, UA Few None seen/Few  Veritor Flu A/B Waived  Result Value Ref Range   Influenza A Negative Negative   Influenza B Negative Negative  CBC With Differential/Platelet  Result Value Ref Range   WBC 7.5 3.4 - 10.8 x10E3/uL   RBC 4.17 4.14 - 5.80 x10E6/uL   Hemoglobin 13.7 13.0 - 17.7 g/dL   Hematocrit  04.8 88.9 - 51.0 %   MCV 90 79 - 97 fL   MCH 32.9 26.6 - 33.0 pg   MCHC 36.4 (H) 31.5 - 35.7 g/dL   RDW 16.9 45.0 - 38.8 %   Platelets 169 150 - 450 x10E3/uL   Neutrophils 70 Not Estab. %   Lymphs 22 Not Estab. %   MID 8 Not Estab. %   Neutrophils Absolute 5.2 1.4 - 7.0 x10E3/uL   Lymphocytes Absolute 1.7 0.7 - 3.1 x10E3/uL   MID (Absolute) 0.6 0.1 - 1.6 X10E3/uL  UA/M w/rflx Culture, Routine  Result Value Ref Range   Specific Gravity, UA 1.015 1.005 - 1.030   pH, UA 5.5 5.0 - 7.5   Color, UA Orange Yellow   Appearance Ur Hazy (A) Clear   Leukocytes, UA Trace (A) Negative   Protein, UA 1+ (A) Negative/Trace   Glucose, UA Negative Negative   Ketones, UA Negative Negative   RBC, UA Negative Negative   Bilirubin, UA Negative Negative   Urobilinogen, Ur 1.0 0.2 - 1.0 mg/dL   Nitrite, UA Negative Negative   Microscopic Examination See below:       Assessment & Plan:   Problem List Items Addressed This Visit      Cardiovascular and Mediastinum   Hypertension - Primary    Under good control on current regimen. Continue current regimen. Continue to monitor. Call with any concerns. Refills given. Labs drawn today.       Relevant Medications   hydrochlorothiazide (MICROZIDE) 12.5 MG capsule   losartan (COZAAR) 50 MG tablet     Other   Hyperlipidemia    Under good control on current regimen. Continue current regimen. Continue to monitor. Call with any concerns. Refills given. Labs drawn today.       Relevant Medications   hydrochlorothiazide (MICROZIDE) 12.5 MG capsule   losartan (COZAAR) 50 MG tablet   Lymphocytosis    Rechecking labs today. Await results. Call with any concerns.           Follow up plan: Return November, for Follow up/Wellness.

## 2018-09-14 DIAGNOSIS — L578 Other skin changes due to chronic exposure to nonionizing radiation: Secondary | ICD-10-CM | POA: Diagnosis not present

## 2018-09-14 DIAGNOSIS — L400 Psoriasis vulgaris: Secondary | ICD-10-CM | POA: Diagnosis not present

## 2018-09-14 DIAGNOSIS — L570 Actinic keratosis: Secondary | ICD-10-CM | POA: Diagnosis not present

## 2018-09-22 DIAGNOSIS — B351 Tinea unguium: Secondary | ICD-10-CM | POA: Diagnosis not present

## 2018-09-22 DIAGNOSIS — M79675 Pain in left toe(s): Secondary | ICD-10-CM | POA: Diagnosis not present

## 2018-09-22 DIAGNOSIS — M79674 Pain in right toe(s): Secondary | ICD-10-CM | POA: Diagnosis not present

## 2019-02-28 ENCOUNTER — Other Ambulatory Visit: Payer: Self-pay

## 2019-02-28 ENCOUNTER — Ambulatory Visit (INDEPENDENT_AMBULATORY_CARE_PROVIDER_SITE_OTHER): Payer: Medicare Other | Admitting: Family Medicine

## 2019-02-28 ENCOUNTER — Encounter: Payer: Self-pay | Admitting: Family Medicine

## 2019-02-28 VITALS — BP 129/74 | HR 60 | Temp 97.8°F | Ht 67.8 in | Wt 168.5 lb

## 2019-02-28 DIAGNOSIS — D72820 Lymphocytosis (symptomatic): Secondary | ICD-10-CM | POA: Diagnosis not present

## 2019-02-28 DIAGNOSIS — Z0000 Encounter for general adult medical examination without abnormal findings: Secondary | ICD-10-CM

## 2019-02-28 DIAGNOSIS — R3911 Hesitancy of micturition: Secondary | ICD-10-CM | POA: Diagnosis not present

## 2019-02-28 DIAGNOSIS — I10 Essential (primary) hypertension: Secondary | ICD-10-CM

## 2019-02-28 DIAGNOSIS — Z23 Encounter for immunization: Secondary | ICD-10-CM

## 2019-02-28 DIAGNOSIS — E782 Mixed hyperlipidemia: Secondary | ICD-10-CM

## 2019-02-28 DIAGNOSIS — Z1159 Encounter for screening for other viral diseases: Secondary | ICD-10-CM | POA: Diagnosis not present

## 2019-02-28 MED ORDER — LOSARTAN POTASSIUM 50 MG PO TABS: 50.0000 mg | ORAL_TABLET | Freq: Every day | ORAL | 2 refills | Status: DC

## 2019-02-28 MED ORDER — HYDROCHLOROTHIAZIDE 12.5 MG PO CAPS: 12.5000 mg | ORAL_CAPSULE | Freq: Every day | ORAL | 2 refills | Status: DC

## 2019-02-28 NOTE — Assessment & Plan Note (Signed)
Rechecking levels today. Await results.  

## 2019-02-28 NOTE — Progress Notes (Signed)
BP 129/74   Pulse 60   Temp 97.8 F (36.6 C)   Ht 5' 7.8" (1.722 m)   Wt 168 lb 8 oz (76.4 kg)   SpO2 100%   BMI 25.78 kg/m    Subjective:    Patient ID: Bryan West, male    DOB: 06/15/1960, 58 y.o.   MRN: [id number]  HPI: Bryan West is a 58 y.o. male presenting on 02/28/2019 for comprehensive medical examination. Current medical complaints include:  HYPERTENSION / HYPERLIPIDEMIA Satisfied with current treatment? yes Duration of hypertension: chronic BP monitoring frequency: not checking BP medication side effects: no Past BP meds: losartan, HCTZ Duration of hyperlipidemia: chronic Cholesterol medication side effects: no Cholesterol supplements: none Past cholesterol medications: none Medication compliance: excellent compliance Aspirin: no Recent stressors: no Recurrent headaches: no Visual changes: no Palpitations: no Dyspnea: no Chest pain: no Lower extremity edema: no Dizzy/lightheaded: no  He currently lives with: brother and sister in law Interim Problems from his last visit: no  Functional Status Survey: Is the patient deaf or have difficulty hearing?: No Does the patient have difficulty seeing, even when wearing glasses/contacts?: No Does the patient have difficulty concentrating, remembering, or making decisions?: Yes Does the patient have difficulty walking or climbing stairs?: No Does the patient have difficulty dressing or bathing?: No Does the patient have difficulty doing errands alone such as visiting a doctor's office or shopping?: Yes  FALL RISK: Fall Risk  02/28/2019 02/26/2017  Falls in the past year? 0 No  Number falls in past yr: 0 -  Injury with Fall? 0 -    Depression Screen Depression screen [facility] 2/9 02/28/2019 02/26/2017 01/31/2016  Decreased Interest 0 0 0  Down, Depressed, Hopeless 0 0 0  PHQ - 2 Score 0 0 0  Altered sleeping 0 - -  Tired, decreased energy 0 - -  Change in appetite 0 - -  Feeling bad or failure about yourself   0 - -  Trouble concentrating 0 - -  Moving slowly or fidgety/restless 0 - -  Suicidal thoughts 0 - -  PHQ-9 Score 0 - -  Difficult doing work/chores Not difficult at all - -   Past Medical History:  Past Medical History:  Diagnosis Date  . Hyperbilirubinemia   . Hyperlipidemia   . Hypertension   . Lymphocytosis   . MR (mental retardation)   . Prostatitis   . Prostatitis     Surgical History:  Past Surgical History:  Procedure Laterality Date  . CHOLECYSTECTOMY      Medications:  Current Outpatient Medications on File Prior to Visit  Medication Sig  . calcipotriene-betamethasone (TACLONEX) ointment   . ketoconazole (NIZORAL) 2 % shampoo   . mometasone (ELOCON) 0.1 % cream   . mometasone (ELOCON) 0.1 % lotion    No current facility-administered medications on file prior to visit.     Allergies:  No Known Allergies  Social History:  Social History   Socioeconomic History  . Marital status: Single    Spouse name: Not on file  . Number of children: Not on file  . Years of education: Not on file  . Highest education level: Not on file  Occupational History  . Not on file  Social Needs  . Financial resource strain: Not on file  . Food insecurity    Worry: Not on file    Inability: Not on file  . Transportation needs    Medical: Not on file    Non-medical:  Not on file  Tobacco Use  . Smoking status: Never Smoker  . Smokeless tobacco: Never Used  Substance and Sexual Activity  . Alcohol use: No  . Drug use: No  . Sexual activity: Not on file  Lifestyle  . Physical activity    Days per week: Not on file    Minutes per session: Not on file  . Stress: Not on file  Relationships  . Social Herbalist on phone: Not on file    Gets together: Not on file    Attends religious service: Not on file    Active member of club or organization: Not on file    Attends meetings of clubs or organizations: Not on file    Relationship status: Not on file  .  Intimate partner violence    Fear of current or ex partner: Not on file    Emotionally abused: Not on file    Physically abused: Not on file    Forced sexual activity: Not on file  Other Topics Concern  . Not on file  Social History Narrative  . Not on file   Social History   Tobacco Use  Smoking Status Never Smoker  Smokeless Tobacco Never Used   Social History   Substance and Sexual Activity  Alcohol Use No    Family History:  Family History  Problem Relation Age of Onset  . Pulmonary fibrosis Mother   . Hypertension Father   . Cancer Father        prostate  . Cancer Brother        skin  . Hypertension Brother     Past medical history, surgical history, medications, allergies, family history and social history reviewed with patient today and changes made to appropriate areas of the chart.   Review of Systems  Constitutional: Negative.   HENT: Negative.   Eyes: Negative.   Respiratory: Negative.   Cardiovascular: Negative.   Gastrointestinal: Negative for abdominal pain, blood in stool, constipation, diarrhea, heartburn, melena, nausea and vomiting.  Genitourinary: Negative.   Musculoskeletal: Negative.   Skin: Negative.   Neurological: Negative.   Endo/Heme/Allergies: Negative.   Psychiatric/Behavioral: Negative.     All other ROS negative except what is listed above and in the HPI.      Objective:    BP 129/74   Pulse 60   Temp 97.8 F (36.6 C)   Ht 5' 7.8" (1.722 m)   Wt 168 lb 8 oz (76.4 kg)   SpO2 100%   BMI 25.78 kg/m   Wt Readings from Last 3 Encounters:  02/28/19 168 lb 8 oz (76.4 kg)  06/23/18 178 lb (80.7 kg)  03/07/18 175 lb 7 oz (79.6 kg)    Physical Exam Vitals signs and nursing note reviewed.  Constitutional:      General: He is not in acute distress.    Appearance: Normal appearance. He is obese. He is not ill-appearing, toxic-appearing or diaphoretic.  HENT:     Head: Normocephalic and atraumatic.     Right Ear: Tympanic  membrane, ear canal and external ear normal. There is no impacted cerumen.     Left Ear: Tympanic membrane, ear canal and external ear normal. There is no impacted cerumen.     Nose: Nose normal. No congestion or rhinorrhea.     Mouth/Throat:     Mouth: Mucous membranes are moist.     Pharynx: Oropharynx is clear. No oropharyngeal exudate or posterior oropharyngeal erythema.  Eyes:  General: No scleral icterus.       Right eye: No discharge.        Left eye: No discharge.     Extraocular Movements: Extraocular movements intact.     Conjunctiva/sclera: Conjunctivae normal.     Pupils: Pupils are equal, round, and reactive to light.  Neck:     Musculoskeletal: Normal range of motion and neck supple. No neck rigidity or muscular tenderness.     Vascular: No carotid bruit.  Cardiovascular:     Rate and Rhythm: Normal rate and regular rhythm.     Pulses: Normal pulses.     Heart sounds: No murmur. No friction rub. No gallop.   Pulmonary:     Effort: Pulmonary effort is normal. No respiratory distress.     Breath sounds: Normal breath sounds. No stridor. No wheezing, rhonchi or rales.  Chest:     Chest wall: No tenderness.  Abdominal:     General: Abdomen is flat. Bowel sounds are normal. There is no distension.     Palpations: Abdomen is soft. There is no mass.     Tenderness: There is no abdominal tenderness. There is no right CVA tenderness, left CVA tenderness, guarding or rebound.     Hernia: No hernia is present.  Genitourinary:    Comments: Genital exam deferred with shared decision making Musculoskeletal:        General: No swelling, tenderness, deformity or signs of injury.     Right lower leg: No edema.     Left lower leg: No edema.  Lymphadenopathy:     Cervical: No cervical adenopathy.  Skin:    General: Skin is warm and dry.     Capillary Refill: Capillary refill takes less than 2 seconds.     Coloration: Skin is not jaundiced or pale.     Findings: No bruising,  erythema, lesion or rash.  Neurological:     General: No focal deficit present.     Mental Status: He is alert and oriented to person, place, and time.     Cranial Nerves: No cranial nerve deficit.     Sensory: No sensory deficit.     Motor: No weakness.     Coordination: Coordination normal.     Gait: Gait normal.     Deep Tendon Reflexes: Reflexes normal.  Psychiatric:        Mood and Affect: Mood normal.        Behavior: Behavior normal.        Thought Content: Thought content normal.        Judgment: Judgment normal.      Results for orders placed or performed in visit on 03/07/18  Microscopic Examination   URINE  Result Value Ref Range   WBC, UA 0-5 0 - 5 /hpf   RBC, UA 0-2 0 - 2 /hpf   Epithelial Cells (non renal) CANCELED    Bacteria, UA Few None seen/Few  Veritor Flu A/B Waived  Result Value Ref Range   Influenza A Negative Negative   Influenza B Negative Negative  CBC With Differential/Platelet  Result Value Ref Range   WBC 7.5 3.4 - 10.8 x10E3/uL   RBC 4.17 4.14 - 5.80 x10E6/uL   Hemoglobin 13.7 13.0 - 17.7 g/dL   Hematocrit 37.6 37.5 - 51.0 %   MCV 90 79 - 97 fL   MCH 32.9 26.6 - 33.0 pg   MCHC 36.4 (H) 31.5 - 35.7 g/dL   RDW 13.4 12.3 - 15.4 %  Platelets 169 150 - 450 x10E3/uL   Neutrophils 70 Not Estab. %   Lymphs 22 Not Estab. %   MID 8 Not Estab. %   Neutrophils Absolute 5.2 1.4 - 7.0 x10E3/uL   Lymphocytes Absolute 1.7 0.7 - 3.1 x10E3/uL   MID (Absolute) 0.6 0.1 - 1.6 X10E3/uL  UA/M w/rflx Culture, Routine   Specimen: Urine   URINE  Result Value Ref Range   Specific Gravity, UA 1.015 1.005 - 1.030   pH, UA 5.5 5.0 - 7.5   Color, UA Orange Yellow   Appearance Ur Hazy (A) Clear   Leukocytes, UA Trace (A) Negative   Protein, UA 1+ (A) Negative/Trace   Glucose, UA Negative Negative   Ketones, UA Negative Negative   RBC, UA Negative Negative   Bilirubin, UA Negative Negative   Urobilinogen, Ur 1.0 0.2 - 1.0 mg/dL   Nitrite, UA Negative  Negative   Microscopic Examination See below:       Assessment & Plan:   Problem List Items Addressed This Visit      Cardiovascular and Mediastinum   Hypertension    Under good control on current regimen. Continue current regimen. Continue to monitor. Call with any concerns. Refills given. Labs drawn today.       Relevant Medications   hydrochlorothiazide (MICROZIDE) 12.5 MG capsule   losartan (COZAAR) 50 MG tablet   Other Relevant Orders   Comp Met (CMET)   Microalbumin, Urine Waived   TSH   UA/M w/rflx Culture, Routine     Other   Hyperlipidemia    Under good control on diet and exercise. Continue current regimen. Continue to monitor. Call with any concerns. Labs drawn today.      Relevant Medications   hydrochlorothiazide (MICROZIDE) 12.5 MG capsule   losartan (COZAAR) 50 MG tablet   Other Relevant Orders   Comp Met (CMET)   Lipid Panel w/o Chol/HDL Ratio OUT   UA/M w/rflx Culture, Routine   Lymphocytosis    Rechecking levels today. Await results.       Relevant Orders   CBC with Differential OUT   UA/M w/rflx Culture, Routine    Other Visit Diagnoses    Wellness examination    -  Primary   Preventative care discussed today as below. Continue to monitor.    Hesitancy       Labs checked today. Await results.    Relevant Orders   PSA   Encounter for hepatitis C screening test for low risk patient       Labs drawn today. Await results.    Relevant Orders   Hepatitis C Antibody   Immunization due       Flu shot given today.   Relevant Orders   Flu Vaccine QUAD 6+ mos PF IM (Fluarix Quad PF) (Completed)       Preventative Services:  Health Risk Assessment and Personalized Prevention Plan: Done today Bone Mass Measurements: N/A CVD Screening: Done today Colon Cancer Screening: Up to date Depression Screening: Done today Diabetes Screening: Done today Glaucoma Screening: See your eye doctor Hepatitis B vaccine: N/A Hepatitis C screening: Done today  HIV Screening: up to date Flu Vaccine: Done today Lung cancer Screening: N/A Obesity Screening: Done today Pneumonia Vaccines (2): Not until you're 65 STI Screening: N/a PSA screening: Done today  LABORATORY TESTING:  Health maintenance labs ordered today as discussed above.   The natural history of prostate cancer and ongoing controversy regarding screening and potential treatment outcomes of prostate cancer  has been discussed with the patient. The meaning of a false positive PSA and a false negative PSA has been discussed. He indicates understanding of the limitations of this screening test and wishes to proceed with screening PSA testing.   IMMUNIZATIONS:   - Tdap: Tetanus vaccination status reviewed: last tetanus booster within 10 years. - Influenza: Up to date - Pneumovax: Not applicable  SCREENING: - Colonoscopy: Up to date  Discussed with patient purpose of the colonoscopy is to detect colon cancer at curable precancerous or early stages    PATIENT COUNSELING:    Sexuality: Discussed sexually transmitted diseases, partner selection, use of condoms, avoidance of unintended pregnancy  and contraceptive alternatives.   Advised to avoid cigarette smoking.  I discussed with the patient that most people either abstain from alcohol or drink within safe limits (<=14/week and <=4 drinks/occasion for males, <=7/weeks and <= 3 drinks/occasion for females) and that the risk for alcohol disorders and other health effects rises proportionally with the number of drinks per week and how often a drinker exceeds daily limits.  Discussed cessation/primary prevention of drug use and availability of treatment for abuse.   Diet: Encouraged to adjust caloric intake to maintain  or achieve ideal body weight, to reduce intake of dietary saturated fat and total fat, to limit sodium intake by avoiding high sodium foods and not adding table salt, and to maintain adequate dietary potassium and calcium  preferably from fresh fruits, vegetables, and low-fat dairy products.    stressed the importance of regular exercise  Injury prevention: Discussed safety belts, safety helmets, smoke detector, smoking near bedding or upholstery.   Dental health: Discussed importance of regular tooth brushing, flossing, and dental visits.   Follow up plan: NEXT PREVENTATIVE PHYSICAL DUE IN 1 YEAR. Return in about 6 months (around 08/28/2019).

## 2019-02-28 NOTE — Assessment & Plan Note (Addendum)
Under good control on diet and exercise. Continue current regimen. Continue to monitor. Call with any concerns. Labs drawn today.

## 2019-02-28 NOTE — Patient Instructions (Addendum)
Preventative Services:  Health Risk Assessment and Personalized Prevention Plan: Done today Bone Mass Measurements: N/A CVD Screening: Done today Colon Cancer Screening: Up to date Depression Screening: Done today Diabetes Screening: Done today Glaucoma Screening: See your eye doctor Hepatitis B vaccine: N/A Hepatitis C screening: Done today HIV Screening: up to date Flu Vaccine: Done today Lung cancer Screening: N/A Obesity Screening: Done today Pneumonia Vaccines (2): Not until you're 65 STI Screening: N/a PSA screening: Done today  Influenza (Flu) Vaccine (Inactivated or Recombinant): What You Need to Know 1. Why get vaccinated? Influenza vaccine can prevent influenza (flu). Flu is a contagious disease that spreads around the [place] every year, usually between October and May. Anyone can get the flu, but it is more dangerous for some people. Infants and young children, people 59 years of age and older, pregnant women, and people with certain health conditions or a weakened immune system are at greatest risk of flu complications. Pneumonia, bronchitis, sinus infections and ear infections are examples of flu-related complications. If you have a medical condition, such as heart disease, cancer or diabetes, flu can make it worse. Flu can cause fever and chills, sore throat, muscle aches, fatigue, cough, headache, and runny or stuffy nose. Some people may have vomiting and diarrhea, though this is more common in children than adults. Each year thousands of people in the [place] die from flu, and many more are hospitalized. Flu vaccine prevents millions of illnesses and flu-related visits to the doctor each year. 2. Influenza vaccine CDC recommends everyone 59 years of age and older get vaccinated every flu season. Children 6 months through 59 years of age may need 2 doses during a single flu season. Everyone else needs only 1 dose each flu season. It takes about 2 weeks for  protection to develop after vaccination. There are many flu viruses, and they are always changing. Each year a new flu vaccine is made to protect against three or four viruses that are likely to cause disease in the upcoming flu season. Even when the vaccine doesn't exactly match these viruses, it may still provide some protection. Influenza vaccine does not cause flu. Influenza vaccine may be given at the same time as other vaccines. 3. Talk with your health care provider Tell your vaccine provider if the person getting the vaccine:  Has had an allergic reaction after a previous dose of influenza vaccine, or has any severe, life-threatening allergies.  Has ever had Guillain-Barr Syndrome (also called GBS). In some cases, your health care provider may decide to postpone influenza vaccination to a future visit. People with minor illnesses, such as a cold, may be vaccinated. People who are moderately or severely ill should usually wait until they recover before getting influenza vaccine. Your health care provider can give you more information. 4. Risks of a vaccine reaction  Soreness, redness, and swelling where shot is given, fever, muscle aches, and headache can happen after influenza vaccine.  There may be a very small increased risk of Guillain-Barr Syndrome (GBS) after inactivated influenza vaccine (the flu shot). Young children who get the flu shot along with pneumococcal vaccine (PCV13), and/or DTaP vaccine at the same time might be slightly more likely to have a seizure caused by fever. Tell your health care provider if a child who is getting flu vaccine has ever had a seizure. People sometimes faint after medical procedures, including vaccination. Tell your provider if you feel dizzy or have vision changes or ringing in the ears.  As with any medicine, there is a very remote chance of a vaccine causing a severe allergic reaction, other serious injury, or death. 5. What if there is a  serious problem? An allergic reaction could occur after the vaccinated person leaves the clinic. If you see signs of a severe allergic reaction (hives, swelling of the face and throat, difficulty breathing, a fast heartbeat, dizziness, or weakness), call 9-1-1 and get the person to the nearest hospital. For other signs that concern you, call your health care provider. Adverse reactions should be reported to the Vaccine Adverse Event Reporting System (VAERS). Your health care provider will usually file this report, or you can do it yourself. Visit the VAERS website at www.vaers.LAgents.no or call (765)430-7535.VAERS is only for reporting reactions, and VAERS staff do not give medical advice. 6. The National Vaccine Injury Compensation Program The [employer] Vaccine Injury Compensation Program (VICP) is a federal program that was created to compensate people who may have been injured by certain vaccines. Visit the VICP website at SpiritualWord.at or call (346)740-7372 to learn about the program and about filing a claim. There is a time limit to file a claim for compensation. 7. How can I learn more?  Ask your healthcare provider.  Call your local or state health department.  Contact the Centers for Disease Control and Prevention (CDC): ? Call 425-325-7582 (1-800-CDC-INFO) or ? Visit CDC's BiotechRoom.com.cy Vaccine Information Statement (Interim) Inactivated Influenza Vaccine (11/25/2017) This information is not intended to replace advice given to you by your health care provider. Make sure you discuss any questions you have with your health care provider. Document Released: 01/22/2006 Document Revised: 07/19/2018 Document Reviewed: 11/29/2017 Elsevier Patient Education  2020 [employer].  Health Maintenance, Male Adopting a healthy lifestyle and getting preventive care are important in promoting health and wellness. Ask your health care provider about:  The right schedule for you  to have regular tests and exams.  Things you can do on your own to prevent diseases and keep yourself healthy. What should I know about diet, weight, and exercise? Eat a healthy diet   Eat a diet that includes plenty of vegetables, fruits, low-fat dairy products, and lean protein.  Do not eat a lot of foods that are high in solid fats, added sugars, or sodium. Maintain a healthy weight Body mass index (BMI) is a measurement that can be used to identify possible weight problems. It estimates body fat based on height and weight. Your health care provider can help determine your BMI and help you achieve or maintain a healthy weight. Get regular exercise Get regular exercise. This is one of the most important things you can do for your health. Most adults should:  Exercise for at least 150 minutes each week. The exercise should increase your heart rate and make you sweat (moderate-intensity exercise).  Do strengthening exercises at least twice a week. This is in addition to the moderate-intensity exercise.  Spend less time sitting. Even light physical activity can be beneficial. Watch cholesterol and blood lipids Have your blood tested for lipids and cholesterol at 59 years of age, then have this test every 5 years. You may need to have your cholesterol levels checked more often if:  Your lipid or cholesterol levels are high.  You are older than 59 years of age.  You are at high risk for heart disease. What should I know about cancer screening? Many types of cancers can be detected early and may often be prevented. Depending on your  health history and family history, you may need to have cancer screening at various ages. This may include screening for:  Colorectal cancer.  Prostate cancer.  Skin cancer.  Lung cancer. What should I know about heart disease, diabetes, and high blood pressure? Blood pressure and heart disease  High blood pressure causes heart disease and increases  the risk of stroke. This is more likely to develop in people who have high blood pressure readings, are of African descent, or are overweight.  Talk with your health care provider about your target blood pressure readings.  Have your blood pressure checked: ? Every 3-5 years if you are 71-27 years of age. ? Every year if you are 41 years old or older.  If you are between the ages of 51 and 70 and are a current or former smoker, ask your health care provider if you should have a one-time screening for abdominal aortic aneurysm (AAA). Diabetes Have regular diabetes screenings. This checks your fasting blood sugar level. Have the screening done:  Once every three years after age 79 if you are at a normal weight and have a low risk for diabetes.  More often and at a younger age if you are overweight or have a high risk for diabetes. What should I know about preventing infection? Hepatitis B If you have a higher risk for hepatitis B, you should be screened for this virus. Talk with your health care provider to find out if you are at risk for hepatitis B infection. Hepatitis C Blood testing is recommended for:  Everyone born from [birth year] through 1966.  Anyone with known risk factors for hepatitis C. Sexually transmitted infections (STIs)  You should be screened each year for STIs, including gonorrhea and chlamydia, if: ? You are sexually active and are younger than 59 years of age. ? You are older than 59 years of age and your health care provider tells you that you are at risk for this type of infection. ? Your sexual activity has changed since you were last screened, and you are at increased risk for chlamydia or gonorrhea. Ask your health care provider if you are at risk.  Ask your health care provider about whether you are at high risk for HIV. Your health care provider may recommend a prescription medicine to help prevent HIV infection. If you choose to take medicine to prevent HIV, you  should first get tested for HIV. You should then be tested every 3 months for as long as you are taking the medicine. Follow these instructions at home: Lifestyle  Do not use any products that contain nicotine or tobacco, such as cigarettes, e-cigarettes, and chewing tobacco. If you need help quitting, ask your health care provider.  Do not use street drugs.  Do not share needles.  Ask your health care provider for help if you need support or information about quitting drugs. Alcohol use  Do not drink alcohol if your health care provider tells you not to drink.  If you drink alcohol: ? Limit how much you have to 0-2 drinks a day. ? Be aware of how much alcohol is in your drink. In the [place] one drink equals one 12 oz bottle of beer (355 mL), one 5 oz glass of wine (148 mL), or one 1 oz glass of hard liquor (44 mL). General instructions  Schedule regular health, dental, and eye exams.  Stay current with your vaccines.  Tell your health care provider if: ? You often feel depressed. ?  You have ever been abused or do not feel safe at home. Summary  Adopting a healthy lifestyle and getting preventive care are important in promoting health and wellness.  Follow your health care provider's instructions about healthy diet, exercising, and getting tested or screened for diseases.  Follow your health care provider's instructions on monitoring your cholesterol and blood pressure. This information is not intended to replace advice given to you by your health care provider. Make sure you discuss any questions you have with your health care provider. Document Released: 09/26/2007 Document Revised: 03/23/2018 Document Reviewed: 03/23/2018 Elsevier Patient Education  2020 [employer]

## 2019-02-28 NOTE — Assessment & Plan Note (Signed)
Under good control on current regimen. Continue current regimen. Continue to monitor. Call with any concerns. Refills given. Labs drawn today.   

## 2019-03-01 LAB — COMPREHENSIVE METABOLIC PANEL
ALT: 14 IU/L (ref 0–44)
AST: 6 IU/L (ref 0–40)
Albumin/Globulin Ratio: 1.5 (ref 1.2–2.2)
Albumin: 4.3 g/dL (ref 3.8–4.9)
Alkaline Phosphatase: 88 IU/L (ref 39–117)
BUN/Creatinine Ratio: 12 (ref 9–20)
BUN: 10 mg/dL (ref 6–24)
Bilirubin Total: 1.3 mg/dL — ABNORMAL HIGH (ref 0.0–1.2)
CO2: 27 mmol/L (ref 20–29)
Calcium: 9.4 mg/dL (ref 8.7–10.2)
Chloride: 101 mmol/L (ref 96–106)
Creatinine, Ser: 0.81 mg/dL (ref 0.76–1.27)
GFR calc Af Amer: 112 mL/min/{1.73_m2} (ref >59)
GFR calc non Af Amer: 97 mL/min/{1.73_m2} (ref >59)
Globulin, Total: 2.9 g/dL (ref 1.5–4.5)
Glucose: 97 mg/dL (ref 65–99)
Potassium: 4.4 mmol/L (ref 3.5–5.2)
Sodium: 140 mmol/L (ref 134–144)
Total Protein: 7.2 g/dL (ref 6.0–8.5)

## 2019-03-01 LAB — CBC WITH DIFFERENTIAL/PLATELET
Basophils Absolute: 0.1 10*3/uL (ref 0.0–0.2)
Basos: 1 %
EOS (ABSOLUTE): 0.2 10*3/uL (ref 0.0–0.4)
Eos: 3 %
Hematocrit: 44.3 % (ref 37.5–51.0)
Hemoglobin: 15.2 g/dL (ref 13.0–17.7)
Immature Grans (Abs): 0 10*3/uL (ref 0.0–0.1)
Immature Granulocytes: 1 %
Lymphocytes Absolute: 2.5 10*3/uL (ref 0.7–3.1)
Lymphs: 37 %
MCH: 30.7 pg (ref 26.6–33.0)
MCHC: 34.3 g/dL (ref 31.5–35.7)
MCV: 90 fL (ref 79–97)
Monocytes Absolute: 0.5 10*3/uL (ref 0.1–0.9)
Monocytes: 8 %
Neutrophils Absolute: 3.4 10*3/uL (ref 1.4–7.0)
Neutrophils: 50 %
Platelets: 193 10*3/uL (ref 150–450)
RBC: 4.95 x10E6/uL (ref 4.14–5.80)
RDW: 13 % (ref 11.6–15.4)
WBC: 6.7 10*3/uL (ref 3.4–10.8)

## 2019-03-01 LAB — TSH: TSH: 0.869 u[IU]/mL (ref 0.450–4.500)

## 2019-03-01 LAB — PSA: Prostate Specific Ag, Serum: 3.4 ng/mL (ref 0.0–4.0)

## 2019-03-01 LAB — LIPID PANEL W/O CHOL/HDL RATIO
Cholesterol, Total: 191 mg/dL (ref 100–199)
HDL: 43 mg/dL (ref >39)
LDL Chol Calc (NIH): 112 mg/dL — ABNORMAL HIGH (ref 0–99)
Triglycerides: 204 mg/dL — ABNORMAL HIGH (ref 0–149)
VLDL Cholesterol Cal: 36 mg/dL (ref 5–40)

## 2019-03-01 LAB — HEPATITIS C ANTIBODY: Hep C Virus Ab: <0.1 s/co ratio (ref 0.0–0.9)

## 2019-03-05 ENCOUNTER — Telehealth: Payer: Self-pay | Admitting: Family Medicine

## 2019-03-05 LAB — UA/M W/RFLX CULTURE, ROUTINE
Bilirubin, UA: NEGATIVE
Glucose, UA: NEGATIVE
Ketones, UA: NEGATIVE
Nitrite, UA: NEGATIVE
Protein,UA: NEGATIVE
Specific Gravity, UA: 1.025 (ref 1.005–1.030)
Urobilinogen, Ur: 0.2 mg/dL (ref 0.2–1.0)
pH, UA: 5.5 (ref 5.0–7.5)

## 2019-03-05 LAB — URINE CULTURE, REFLEX

## 2019-03-05 LAB — MICROSCOPIC EXAMINATION

## 2019-03-05 LAB — MICROALBUMIN, URINE WAIVED
Creatinine, Urine Waived: 100 mg/dL (ref 10–300)
Microalb, Ur Waived: 30 mg/L — ABNORMAL HIGH (ref 0–19)
Microalb/Creat Ratio: <30 mg/g (ref <30)

## 2019-03-05 MED ORDER — CIPROFLOXACIN HCL 500 MG PO TABS: 500.0000 mg | ORAL_TABLET | Freq: Two times a day (BID) | ORAL | 0 refills | Status: DC

## 2019-03-05 NOTE — Telephone Encounter (Signed)
Please let his brother know that his labs came back nice and normal except he has a UTI. I've sent an antibiotic over to his pharmacy. Thanks!

## 2019-03-06 NOTE — Telephone Encounter (Signed)
Pt's brother called stating he would like someone to go over results with him. Please advise and NT team cannot speak with patient without note.

## 2019-03-06 NOTE — Telephone Encounter (Signed)
Patient's brother notified   

## 2019-07-17 ENCOUNTER — Telehealth: Payer: Self-pay

## 2019-07-17 DIAGNOSIS — H6120 Impacted cerumen, unspecified ear: Secondary | ICD-10-CM

## 2019-07-17 NOTE — Telephone Encounter (Signed)
Copied from CRM [id number]. Topic: Referral - Request for Referral >> Jul 17, 2019  8:14 AM Gwenlyn Fudge wrote: Has patient seen PCP for this complaint? Yes.   *If NO, is insurance requiring patient see PCP for this issue before PCP can refer them? Referral for which specialty: ENT Preferred provider/office: [facility]// Dr. Sheran Spine Reason for referral: Needing ears cleaned out    Routing to provider.

## 2019-07-18 DIAGNOSIS — H6123 Impacted cerumen, bilateral: Secondary | ICD-10-CM | POA: Diagnosis not present

## 2019-07-18 DIAGNOSIS — H93292 Other abnormal auditory perceptions, left ear: Secondary | ICD-10-CM | POA: Diagnosis not present

## 2019-08-27 NOTE — Progress Notes (Signed)
BP 120/64 (BP Location: Left Arm, Patient Position: Sitting, Cuff Size: Normal)   Pulse 70   Temp 97.8 F (36.6 C) (Oral)   Ht 5' 7.72" (1.72 m)   Wt 175 lb 6.4 oz (79.6 kg)   SpO2 99%   BMI 26.89 kg/m    Subjective:    Patient ID: Bryan West, male    DOB: Mar 13, 1961, 59 y.o.   MRN: [id number]  HPI: Bryan West is a 59 y.o. male  Chief Complaint  Patient presents with  . Hypertension  . Hyperlipidemia  . Arm Pain    right arm   HYPERTENSION / HYPERLIPIDEMIA Satisfied with current treatment? yes Duration of hypertension: chronic BP monitoring frequency: not checking BP medication side effects: no Past BP meds: losartan, HCTZ Duration of hyperlipidemia: chronic Cholesterol medication side effects: not on anything Medication compliance: excellent compliance Aspirin: no Recent stressors: no Recurrent headaches: no Visual changes: no Palpitations: no Dyspnea: no Chest pain: no Lower extremity edema: no Dizzy/lightheaded: no   Has been doing a lot of weed eating and has some pain in his R forearm. No numbness or tingling. Worse after working. Hasn't tried anything to make it better. Radiates into his arm. It's been going on about 2 weeks. No other concerns or complaints at this time.   Relevant past medical, surgical, family and social history reviewed and updated as indicated. Interim medical history since our last visit reviewed. Allergies and medications reviewed and updated.  Review of Systems  Constitutional: Negative.   Respiratory: Negative.   Cardiovascular: Negative.   Gastrointestinal: Negative.   Musculoskeletal: Positive for myalgias. Negative for arthralgias, back pain, gait problem, joint swelling, neck pain and neck stiffness.  Skin: Negative.   Neurological: Negative.   Psychiatric/Behavioral: Negative.     Per HPI unless specifically indicated above     Objective:    BP 120/64 (BP Location: Left Arm, Patient Position: Sitting, Cuff Size:  Normal)   Pulse 70   Temp 97.8 F (36.6 C) (Oral)   Ht 5' 7.72" (1.72 m)   Wt 175 lb 6.4 oz (79.6 kg)   SpO2 99%   BMI 26.89 kg/m   Wt Readings from Last 3 Encounters:  08/28/19 175 lb 6.4 oz (79.6 kg)  02/28/19 168 lb 8 oz (76.4 kg)  06/23/18 178 lb (80.7 kg)    Physical Exam Vitals and nursing note reviewed.  Constitutional:      General: He is not in acute distress.    Appearance: Normal appearance. He is not ill-appearing, toxic-appearing or diaphoretic.  HENT:     Head: Normocephalic and atraumatic.     Right Ear: External ear normal.     Left Ear: External ear normal.     Nose: Nose normal.     Mouth/Throat:     Mouth: Mucous membranes are moist.     Pharynx: Oropharynx is clear.  Eyes:     General: No scleral icterus.       Right eye: No discharge.        Left eye: No discharge.     Extraocular Movements: Extraocular movements intact.     Conjunctiva/sclera: Conjunctivae normal.     Pupils: Pupils are equal, round, and reactive to light.  Cardiovascular:     Rate and Rhythm: Normal rate and regular rhythm.     Pulses: Normal pulses.     Heart sounds: Normal heart sounds. No murmur. No friction rub. No gallop.   Pulmonary:  Effort: Pulmonary effort is normal. No respiratory distress.     Breath sounds: Normal breath sounds. No stridor. No wheezing, rhonchi or rales.  Chest:     Chest wall: No tenderness.  Musculoskeletal:        General: Tenderness (brachioradialis on the R) present. Normal range of motion.     Cervical back: Normal range of motion and neck supple.  Skin:    General: Skin is warm and dry.     Capillary Refill: Capillary refill takes less than 2 seconds.     Coloration: Skin is not jaundiced or pale.     Findings: No bruising, erythema, lesion or rash.  Neurological:     General: No focal deficit present.     Mental Status: He is alert and oriented to person, place, and time. Mental status is at baseline.  Psychiatric:        Mood and  Affect: Mood normal.        Behavior: Behavior normal.        Thought Content: Thought content normal.        Judgment: Judgment normal.     Results for orders placed or performed in visit on 02/28/19  Microscopic Examination   URINE  Result Value Ref Range   WBC, UA 11-30 (A) 0 - 5 /hpf   RBC 0-2 0 - 2 /hpf   Epithelial Cells (non renal) 0-10 0 - 10 /hpf   Bacteria, UA Moderate (A) None seen/Few  Urine Culture, Reflex   URINE  Result Value Ref Range   Urine Culture, Routine Final report (A)    Organism ID, Bacteria Klebsiella aerogenes (A)    Antimicrobial Susceptibility Comment   CBC with Differential OUT  Result Value Ref Range   WBC 6.7 3.4 - 10.8 x10E3/uL   RBC 4.95 4.14 - 5.80 x10E6/uL   Hemoglobin 15.2 13.0 - 17.7 g/dL   Hematocrit 44.3 37.5 - 51.0 %   MCV 90 79 - 97 fL   MCH 30.7 26.6 - 33.0 pg   MCHC 34.3 31.5 - 35.7 g/dL   RDW 13.0 11.6 - 15.4 %   Platelets 193 150 - 450 x10E3/uL   Neutrophils 50 Not Estab. %   Lymphs 37 Not Estab. %   Monocytes 8 Not Estab. %   Eos 3 Not Estab. %   Basos 1 Not Estab. %   Neutrophils Absolute 3.4 1.4 - 7.0 x10E3/uL   Lymphocytes Absolute 2.5 0.7 - 3.1 x10E3/uL   Monocytes Absolute 0.5 0.1 - 0.9 x10E3/uL   EOS (ABSOLUTE) 0.2 0.0 - 0.4 x10E3/uL   Basophils Absolute 0.1 0.0 - 0.2 x10E3/uL   Immature Granulocytes 1 Not Estab. %   Immature Grans (Abs) 0.0 0.0 - 0.1 x10E3/uL  Comp Met (CMET)  Result Value Ref Range   Glucose 97 65 - 99 mg/dL   BUN 10 6 - 24 mg/dL   Creatinine, Ser 0.81 0.76 - 1.27 mg/dL   GFR calc non Af Amer 97 >59 mL/min/1.73   GFR calc Af Amer 112 >59 mL/min/1.73   BUN/Creatinine Ratio 12 9 - 20   Sodium 140 134 - 144 mmol/L   Potassium 4.4 3.5 - 5.2 mmol/L   Chloride 101 96 - 106 mmol/L   CO2 27 20 - 29 mmol/L   Calcium 9.4 8.7 - 10.2 mg/dL   Total Protein 7.2 6.0 - 8.5 g/dL   Albumin 4.3 3.8 - 4.9 g/dL   Globulin, Total 2.9 1.5 - 4.5 g/dL   Albumin/Globulin  Ratio 1.5 1.2 - 2.2   Bilirubin Total  1.3 (H) 0.0 - 1.2 mg/dL   Alkaline Phosphatase 88 39 - 117 IU/L   AST 6 0 - 40 IU/L   ALT 14 0 - 44 IU/L  Lipid Panel w/o Chol/HDL Ratio OUT  Result Value Ref Range   Cholesterol, Total 191 100 - 199 mg/dL   Triglycerides 204 (H) 0 - 149 mg/dL   HDL 43 >39 mg/dL   VLDL Cholesterol Cal 36 5 - 40 mg/dL   LDL Chol Calc (NIH) 112 (H) 0 - 99 mg/dL  Microalbumin, Urine Waived  Result Value Ref Range   Microalb, Ur Waived 30 (H) 0 - 19 mg/L   Creatinine, Urine Waived 100 10 - 300 mg/dL   Microalb/Creat Ratio <30 <30 mg/g  PSA  Result Value Ref Range   Prostate Specific Ag, Serum 3.4 0.0 - 4.0 ng/mL  TSH  Result Value Ref Range   TSH 0.869 0.450 - 4.500 uIU/mL  UA/M w/rflx Culture, Routine   Specimen: Urine   URINE  Result Value Ref Range   Specific Gravity, UA 1.025 1.005 - 1.030   pH, UA 5.5 5.0 - 7.5   Color, UA Yellow Yellow   Appearance Ur Clear Clear   Leukocytes,UA 1+ (A) Negative   Protein,UA Negative Negative/Trace   Glucose, UA Negative Negative   Ketones, UA Negative Negative   RBC, UA Trace (A) Negative   Bilirubin, UA Negative Negative   Urobilinogen, Ur 0.2 0.2 - 1.0 mg/dL   Nitrite, UA Negative Negative   Microscopic Examination See below:    Urinalysis Reflex Comment   Hepatitis C Antibody  Result Value Ref Range   Hep C Virus Ab <0.1 0.0 - 0.9 s/co ratio      Assessment & Plan:   Problem List Items Addressed This Visit      Cardiovascular and Mediastinum   Hypertension - Primary    Under good control on current regimen. Continue current regimen. Continue to monitor. Call with any concerns. Refills given. Labs drawn today.       Relevant Medications   losartan (COZAAR) 50 MG tablet   hydrochlorothiazide (MICROZIDE) 12.5 MG capsule   Other Relevant Orders   Comprehensive metabolic panel     Other   Hyperlipidemia    Rechecking labs today. Await results. Treat as needed.       Relevant Medications   losartan (COZAAR) 50 MG tablet    hydrochlorothiazide (MICROZIDE) 12.5 MG capsule   Other Relevant Orders   Comprehensive metabolic panel   Lipid Panel w/o Chol/HDL Ratio    Other Visit Diagnoses    Right arm pain       Likely muscle strain. BID alave x 2 weeks. Let us know if not getting better or getting worse.        Follow up plan: Return in about 6 months (around 02/28/2020) for Wellness/follow up.

## 2019-08-28 ENCOUNTER — Encounter: Payer: Self-pay | Admitting: Family Medicine

## 2019-08-28 ENCOUNTER — Ambulatory Visit (INDEPENDENT_AMBULATORY_CARE_PROVIDER_SITE_OTHER): Payer: Medicare Other | Admitting: Family Medicine

## 2019-08-28 ENCOUNTER — Other Ambulatory Visit: Payer: Self-pay

## 2019-08-28 VITALS — BP 120/64 | HR 70 | Temp 97.8°F | Ht 67.72 in | Wt 175.4 lb

## 2019-08-28 DIAGNOSIS — I10 Essential (primary) hypertension: Secondary | ICD-10-CM | POA: Diagnosis not present

## 2019-08-28 DIAGNOSIS — E782 Mixed hyperlipidemia: Secondary | ICD-10-CM

## 2019-08-28 DIAGNOSIS — M79601 Pain in right arm: Secondary | ICD-10-CM | POA: Diagnosis not present

## 2019-08-28 MED ORDER — HYDROCHLOROTHIAZIDE 12.5 MG PO CAPS: 12.5000 mg | ORAL_CAPSULE | Freq: Every day | ORAL | 2 refills | Status: DC

## 2019-08-28 MED ORDER — NAPROXEN 500 MG PO TABS: 500.0000 mg | ORAL_TABLET | Freq: Two times a day (BID) | ORAL | 0 refills | Status: DC

## 2019-08-28 MED ORDER — LOSARTAN POTASSIUM 50 MG PO TABS: 50.0000 mg | ORAL_TABLET | Freq: Every day | ORAL | 2 refills | Status: DC

## 2019-08-28 NOTE — Assessment & Plan Note (Signed)
Rechecking labs today. Await results. Treat as needed.  °

## 2019-08-28 NOTE — Assessment & Plan Note (Signed)
Under good control on current regimen. Continue current regimen. Continue to monitor. Call with any concerns. Refills given. Labs drawn today.   

## 2019-08-29 ENCOUNTER — Encounter: Payer: Self-pay | Admitting: Family Medicine

## 2019-08-29 LAB — COMPREHENSIVE METABOLIC PANEL
ALT: 15 IU/L (ref 0–44)
AST: 6 IU/L (ref 0–40)
Albumin/Globulin Ratio: 1.5 (ref 1.2–2.2)
Albumin: 4.1 g/dL (ref 3.8–4.9)
Alkaline Phosphatase: 94 IU/L (ref 48–121)
BUN/Creatinine Ratio: 13 (ref 10–24)
BUN: 12 mg/dL (ref 8–27)
Bilirubin Total: 1.2 mg/dL (ref 0.0–1.2)
CO2: 28 mmol/L (ref 20–29)
Calcium: 9.4 mg/dL (ref 8.6–10.2)
Chloride: 98 mmol/L (ref 96–106)
Creatinine, Ser: 0.96 mg/dL (ref 0.76–1.27)
GFR calc Af Amer: 99 mL/min/{1.73_m2} (ref >59)
GFR calc non Af Amer: 86 mL/min/{1.73_m2} (ref >59)
Globulin, Total: 2.8 g/dL (ref 1.5–4.5)
Glucose: 117 mg/dL — ABNORMAL HIGH (ref 65–99)
Potassium: 4 mmol/L (ref 3.5–5.2)
Sodium: 138 mmol/L (ref 134–144)
Total Protein: 6.9 g/dL (ref 6.0–8.5)

## 2019-08-29 LAB — LIPID PANEL W/O CHOL/HDL RATIO
Cholesterol, Total: 211 mg/dL — ABNORMAL HIGH (ref 100–199)
HDL: 47 mg/dL (ref >39)
LDL Chol Calc (NIH): 130 mg/dL — ABNORMAL HIGH (ref 0–99)
Triglycerides: 189 mg/dL — ABNORMAL HIGH (ref 0–149)
VLDL Cholesterol Cal: 34 mg/dL (ref 5–40)

## 2019-12-15 ENCOUNTER — Other Ambulatory Visit: Payer: Self-pay | Admitting: Dermatology

## 2019-12-19 ENCOUNTER — Other Ambulatory Visit: Payer: Self-pay | Admitting: Dermatology

## 2019-12-20 ENCOUNTER — Other Ambulatory Visit: Payer: Self-pay

## 2019-12-20 ENCOUNTER — Ambulatory Visit (INDEPENDENT_AMBULATORY_CARE_PROVIDER_SITE_OTHER): Payer: Medicare Other | Admitting: Dermatology

## 2019-12-20 DIAGNOSIS — L578 Other skin changes due to chronic exposure to nonionizing radiation: Secondary | ICD-10-CM

## 2019-12-20 DIAGNOSIS — L821 Other seborrheic keratosis: Secondary | ICD-10-CM | POA: Diagnosis not present

## 2019-12-20 DIAGNOSIS — L409 Psoriasis, unspecified: Secondary | ICD-10-CM | POA: Diagnosis not present

## 2019-12-20 DIAGNOSIS — L570 Actinic keratosis: Secondary | ICD-10-CM | POA: Diagnosis not present

## 2019-12-20 MED ORDER — MOMETASONE FUROATE 0.1 % EX CREA: 1.0000 "application " | TOPICAL_CREAM | CUTANEOUS | 4 refills | Status: DC

## 2019-12-20 MED ORDER — MOMETASONE FUROATE 0.1 % EX SOLN: CUTANEOUS | 4 refills | Status: DC

## 2019-12-20 MED ORDER — KETOCONAZOLE 2 % EX SHAM: 1.0000 "application " | MEDICATED_SHAMPOO | CUTANEOUS | 11 refills | Status: DC

## 2019-12-20 NOTE — Patient Instructions (Addendum)
Cryotherapy Aftercare  . Wash gently with soap and water everyday.   . Apply Vaseline and Band-Aid daily until healed.  

## 2019-12-20 NOTE — Progress Notes (Signed)
   Follow-Up Visit   Subjective  Bryan West is a 59 y.o. male who presents for the following: Psoriasis (scalp, buttocks, needs refills, Ketoconazole 2% shampoo, mometasone lotion and cream). He has history of precancerous actinic keratoses treated in the past.  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  He tries to wear a hat and protect himself better now.  Pt with brother today, who contributes to history.  The following portions of the chart were reviewed this encounter and updated as appropriate:  Tobacco  Allergies  Meds  Problems  Med Hx  Surg Hx  Fam Hx     Review of Systems:  No other skin or systemic complaints except as noted in HPI or Assessment and Plan.  Objective  Well appearing patient in no apparent distress; mood and affect are within normal limits.  A focused examination was performed including scalp, face, arms. Relevant physical exam findings are noted in the Assessment and Plan.  Objective  face/ears x 25, bil arms x 10 (35): Pink scaly macules   Objective  Scalp, buttocks: Plaque sacral area   Assessment & Plan    Actinic Damage - diffuse scaly erythematous macules with underlying dyspigmentation - Recommend daily broad spectrum sunscreen SPF 30+ to sun-exposed areas, reapply every 2 hours as needed.  - Call for new or changing lesions.  Seborrheic Keratoses - Stuck-on, waxy, tan-brown papules and plaques  - Discussed benign etiology and prognosis. - Observe - Call for any changes  AK (actinic keratosis) (35) face/ears x 25, bil arms x 10  Destruction of lesion - face/ears x 25, bil arms x 10 Complexity: simple   Destruction method: cryotherapy   Informed consent: discussed and consent obtained   Timeout:  patient name, date of birth, surgical site, and procedure verified Lesion destroyed using liquid nitrogen: Yes   Region frozen until ice ball extended beyond lesion: Yes   Outcome: patient tolerated procedure well with no  complications   Post-procedure details: wound care instructions given    Psoriasis Scalp, buttocks  Cont Ketoconazole 2% shampo 3x/wk let sit several minutes and rinse out Cont Mometasone lotion qd up to 5d/wk aa scalp prn flares Cont Mometasone cr qd up to 5d/wk aa buttocks/body prn flares  ketoconazole (NIZORAL) 2 % shampoo - Scalp, buttocks  mometasone (ELOCON) 0.1 % cream - Scalp, buttocks  mometasone (ELOCON) 0.1 % lotion - Scalp, buttocks  Return for 6-12 months, psoriasis/AK.   I, Ardis Rowan, RMA, am acting as scribe for Armida Sans, MD .  Documentation: I have reviewed the above documentation for accuracy and completeness, and I agree with the above.  Armida Sans, MD

## 2019-12-21 ENCOUNTER — Encounter: Payer: Self-pay | Admitting: Dermatology

## 2019-12-28 ENCOUNTER — Other Ambulatory Visit: Payer: Self-pay | Admitting: Dermatology

## 2020-02-08 DIAGNOSIS — H2513 Age-related nuclear cataract, bilateral: Secondary | ICD-10-CM | POA: Diagnosis not present

## 2020-03-18 ENCOUNTER — Other Ambulatory Visit: Payer: Self-pay

## 2020-03-18 ENCOUNTER — Encounter: Payer: Self-pay | Admitting: Family Medicine

## 2020-03-18 ENCOUNTER — Ambulatory Visit (INDEPENDENT_AMBULATORY_CARE_PROVIDER_SITE_OTHER): Payer: Medicare Other | Admitting: Family Medicine

## 2020-03-18 ENCOUNTER — Ambulatory Visit (INDEPENDENT_AMBULATORY_CARE_PROVIDER_SITE_OTHER): Payer: Medicare Other

## 2020-03-18 VITALS — Ht 70.0 in | Wt 180.0 lb

## 2020-03-18 VITALS — BP 121/73 | HR 76 | Temp 98.3°F | Ht 67.91 in | Wt 180.2 lb

## 2020-03-18 DIAGNOSIS — R8281 Pyuria: Secondary | ICD-10-CM

## 2020-03-18 DIAGNOSIS — I10 Essential (primary) hypertension: Secondary | ICD-10-CM

## 2020-03-18 DIAGNOSIS — D72820 Lymphocytosis (symptomatic): Secondary | ICD-10-CM

## 2020-03-18 DIAGNOSIS — R3911 Hesitancy of micturition: Secondary | ICD-10-CM | POA: Diagnosis not present

## 2020-03-18 DIAGNOSIS — E782 Mixed hyperlipidemia: Secondary | ICD-10-CM | POA: Diagnosis not present

## 2020-03-18 DIAGNOSIS — Z0000 Encounter for general adult medical examination without abnormal findings: Secondary | ICD-10-CM

## 2020-03-18 DIAGNOSIS — Z23 Encounter for immunization: Secondary | ICD-10-CM

## 2020-03-18 LAB — URINALYSIS, ROUTINE W REFLEX MICROSCOPIC
Bilirubin, UA: NEGATIVE
Glucose, UA: NEGATIVE
Ketones, UA: NEGATIVE
Nitrite, UA: POSITIVE — AB
Protein,UA: NEGATIVE
RBC, UA: NEGATIVE
Specific Gravity, UA: 1.02 (ref 1.005–1.030)
Urobilinogen, Ur: 0.2 mg/dL (ref 0.2–1.0)
pH, UA: 6 (ref 5.0–7.5)

## 2020-03-18 LAB — MICROSCOPIC EXAMINATION

## 2020-03-18 LAB — MICROALBUMIN, URINE WAIVED
Creatinine, Urine Waived: 100 mg/dL (ref 10–300)
Microalb, Ur Waived: 10 mg/L (ref 0–19)
Microalb/Creat Ratio: <30 mg/g (ref <30)

## 2020-03-18 MED ORDER — LOSARTAN POTASSIUM 50 MG PO TABS
50.0000 mg | ORAL_TABLET | Freq: Every day | ORAL | 2 refills | Status: DC
Start: 2020-03-18 — End: 2020-12-20

## 2020-03-18 MED ORDER — NAPROXEN 500 MG PO TABS
500.0000 mg | ORAL_TABLET | Freq: Two times a day (BID) | ORAL | 3 refills | Status: DC
Start: 2020-03-18 — End: 2021-07-10

## 2020-03-18 MED ORDER — HYDROCHLOROTHIAZIDE 12.5 MG PO CAPS
12.5000 mg | ORAL_CAPSULE | Freq: Every day | ORAL | 2 refills | Status: DC
Start: 2020-03-18 — End: 2021-01-09

## 2020-03-18 NOTE — Progress Notes (Signed)
I connected with Bryan West today by telephone and verified that I am speaking with the correct person using two identifiers. Location patient: home Location provider: work Persons participating in the virtual visit: Bryan West, brother Bryan Brod LPN.   I discussed the limitations, risks, security and privacy concerns of performing an evaluation and management service by telephone and the availability of in person appointments. I also discussed with the patient that there may be a patient responsible charge related to this service. The patient expressed understanding and verbally consented to this telephonic visit.    Interactive audio and video telecommunications were attempted between this provider and patient, however failed, due to patient having technical difficulties OR patient did not have access to video capability.  We continued and completed visit with audio only.     Vital signs may be patient reported or missing.  Subjective:   Bryan West is a 59 y.o. male who presents for Medicare Annual/Subsequent preventive examination.  Review of Systems     Cardiac Risk Factors include: advanced age (>23men, >23 women);dyslipidemia;hypertension     Objective:    Today's Vitals   03/18/20 1013  Weight: 180 lb (81.6 kg)  Height: 5\' 10"  (1.778 m)   Body mass index is 25.83 kg/m.  Advanced Directives 03/18/2020  Does Patient Have a Medical Advance Directive? No    Current Medications (verified) Outpatient Encounter Medications as of 03/18/2020  Medication Sig  . calcipotriene-betamethasone (TACLONEX) ointment   . hydrochlorothiazide (MICROZIDE) 12.5 MG capsule Take 1 capsule (12.5 mg total) by mouth daily.  14/09/2019 ketoconazole (NIZORAL) 2 % shampoo   . losartan (COZAAR) 50 MG tablet Take 1 tablet (50 mg total) by mouth daily.  . mometasone (ELOCON) 0.1 % cream   . ketoconazole (NIZORAL) 2 % shampoo Apply 1 application topically 3 (three) times a week. Wash scalp 3 times  weekly, let sit 5 minutes before rinsing out  . mometasone (ELOCON) 0.1 % cream Apply 1 application topically as directed. Qd up to 5 days a week aa psoriasis on buttocks or body prn flares  . mometasone (ELOCON) 0.1 % lotion   . mometasone (ELOCON) 0.1 % lotion Apply topically as directed. Qd up to 5 days a week to aa scalp prn flares  . naproxen (NAPROSYN) 500 MG tablet Take 1 tablet (500 mg total) by mouth 2 (two) times daily with a meal. (Patient not taking: Reported on 03/18/2020)   No facility-administered encounter medications on file as of 03/18/2020.    Allergies (verified) Patient has no known allergies.   History: Past Medical History:  Diagnosis Date  . Hyperbilirubinemia   . Hyperlipidemia   . Hypertension   . Lymphocytosis   . MR (mental retardation)   . Prostatitis   . Prostatitis    Past Surgical History:  Procedure Laterality Date  . CHOLECYSTECTOMY     Family History  Problem Relation Age of Onset  . Pulmonary fibrosis Mother   . Hypertension Father   . Cancer Father        prostate  . Cancer Brother        skin  . Hypertension Brother    Social History   Socioeconomic History  . Marital status: Single    Spouse name: Not on file  . Number of children: Not on file  . Years of education: Not on file  . Highest education level: Not on file  Occupational History  . Not on file  Tobacco Use  . Smoking  status: Never Smoker  . Smokeless tobacco: Never Used  Vaping Use  . Vaping Use: Never used  Substance and Sexual Activity  . Alcohol use: No  . Drug use: No  . Sexual activity: Not on file  Other Topics Concern  . Not on file  Social History Narrative  . Not on file   Social Determinants of Health   Financial Resource Strain: Low Risk   . Difficulty of Paying Living Expenses: Not hard at all  Food Insecurity: No Food Insecurity  . Worried About Programme researcher, broadcasting/film/videounning Out of Food in the Last Year: Never true  . Ran Out of Food in the Last Year: Never true    Transportation Needs: No Transportation Needs  . Lack of Transportation (Medical): No  . Lack of Transportation (Non-Medical): No  Physical Activity: Inactive  . Days of Exercise per Week: 0 days  . Minutes of Exercise per Session: 0 min  Stress: No Stress Concern Present  . Feeling of Stress : Not at all  Social Connections:   . Frequency of Communication with Friends and Family: Not on file  . Frequency of Social Gatherings with Friends and Family: Not on file  . Attends Religious Services: Not on file  . Active Member of Clubs or Organizations: Not on file  . Attends BankerClub or Organization Meetings: Not on file  . Marital Status: Not on file    Tobacco Counseling Counseling given: Not Answered   Clinical Intake:  Pre-visit preparation completed: Yes  Pain : No/denies pain     Nutritional Status: BMI 25 -29 Overweight Nutritional Risks: None Diabetes: No  How often do you need to have someone help you when you read instructions, pamphlets, or other written materials from your doctor or pharmacy?: 5 - Always What is the last grade level you completed in school?: special courses  Diabetic? no  Interpreter Needed?: No  Information entered by :: NAllen LPN   Activities of Daily Living In your present state of health, do you have any difficulty performing the following activities: 03/18/2020  Hearing? N  Vision? N  Difficulty concentrating or making decisions? Y  Comment retardation  Walking or climbing stairs? N  Dressing or bathing? N  Doing errands, shopping? Y  Comment brother takes  Quarry managerreparing Food and eating ? Y  Using the Toilet? N  In the past six months, have you accidently leaked urine? N  Do you have problems with loss of bowel control? N  Managing your Medications? Y  Managing your Finances? Y  Housekeeping or managing your Housekeeping? Y  Some recent data might be hidden    Patient Care Team: Dorcas CarrowJohnson, Megan P, DO as PCP - General (Family  Medicine)  Indicate any recent Medical Services you may have received from other than [facility] providers in the past year (date may be approximate).     Assessment:   This is a routine wellness examination for [place].  Hearing/Vision screen  Hearing Screening   125Hz  250Hz  500Hz  1000Hz  2000Hz  3000Hz  4000Hz  6000Hz  8000Hz   Right ear:           Left ear:           Vision Screening Comments: No regular eye exams  Dietary issues and exercise activities discussed: Current Exercise Habits: The patient does not participate in regular exercise at present  Goals    . Patient Stated     03/18/2020, no goals      Depression Screen PHQ 2/9 Scores 03/18/2020 02/28/2019 02/26/2017  01/31/2016  PHQ - 2 Score 0 0 0 0  PHQ- 9 Score - 0 - -  Exception Documentation - Medical reason - -    Fall Risk Fall Risk  03/18/2020 02/28/2019 02/26/2017  Falls in the past year? 0 0 No  Number falls in past yr: - 0 -  Injury with Fall? - 0 -  Risk for fall due to : Medication side effect - -  Follow up Falls evaluation completed;Education provided - -    FALL RISK PREVENTION PERTAINING TO THE HOME:  Any stairs in or around the home? Yes  If so, are there any without handrails? No  Home free of loose throw rugs in walkways, pet beds, electrical cords, etc? Yes  Adequate lighting in your home to reduce risk of falls? Yes   ASSISTIVE DEVICES UTILIZED TO PREVENT FALLS:  Life alert? No  Use of a cane, walker or w/c? No  Grab bars in the bathroom? Yes  Shower chair or bench in shower? No  Elevated toilet seat or a handicapped toilet? No   TIMED UP AND GO:  Was the test performed? No   Cognitive Function:     6CIT Screen 02/26/2017  What Year? 4 points  What month? 3 points    Immunizations Immunization History  Administered Date(s) Administered  . Influenza,inj,Quad PF,6+ Mos 01/31/2016, 02/26/2017, 12/23/2017, 02/28/2019  . Td 05/23/2007, 06/17/2017    TDAP status: Up to date  Flu  Vaccine status: Completed at today's visit  Pneumococcal vaccine status: Up to date  Covid-19 vaccine status: Declined, Education has been provided regarding the importance of this vaccine but patient still declined. Advised may receive this vaccine at local pharmacy or Health Dept.or vaccine clinic. Aware to provide a copy of the vaccination record if obtained from local pharmacy or Health Dept. Verbalized acceptance and understanding.  Qualifies for Shingles Vaccine? Yes   Zostavax completed No   Shingrix Completed?: No.    Education has been provided regarding the importance of this vaccine. Patient has been advised to call insurance company to determine out of pocket expense if they have not yet received this vaccine. Advised may also receive vaccine at local pharmacy or Health Dept. Verbalized acceptance and understanding.  Screening Tests Health Maintenance  Topic Date Due  . COVID-19 Vaccine (1) Never done  . COLONOSCOPY  10/03/2019  . INFLUENZA VACCINE  11/12/2019  . TETANUS/TDAP  06/18/2027  . Hepatitis C Screening  Completed  . HIV Screening  Completed    Health Maintenance  Health Maintenance Due  Topic Date Due  . COVID-19 Vaccine (1) Never done  . COLONOSCOPY  10/03/2019  . INFLUENZA VACCINE  11/12/2019    Colorectal cancer screening: brother declines  Lung Cancer Screening: (Low Dose CT Chest recommended if Age 65-80 years, 30 pack-year currently smoking OR have quit w/in 15years.) does not qualify.   Lung Cancer Screening Referral: no  Additional Screening:  Hepatitis C Screening: does qualify; Completed 02/28/2019   Vision Screening: Recommended annual ophthalmology exams for early detection of glaucoma and other disorders of the eye. Is the patient up to date with their annual eye exam?  Yes  Who is the provider or what is the name of the office in which the patient attends annual eye exams? [facility] If pt is not established with a provider,  would they like to be referred to a provider to establish care? No .   Dental Screening: Recommended annual dental exams for proper oral  hygiene  Community Resource Referral / Chronic Care Management: CRR required this visit?  No   CCM required this visit?  No      Plan:     I have personally reviewed and noted the following in the patient's chart:   . Medical and social history . Use of alcohol, tobacco or illicit drugs  . Current medications and supplements . Functional ability and status . Nutritional status . Physical activity . Advanced directives . List of other physicians . Hospitalizations, surgeries, and ER visits in previous 12 months . Vitals . Screenings to include cognitive, depression, and falls . Referrals and appointments  In addition, I have reviewed and discussed with patient certain preventive protocols, quality metrics, and best practice recommendations. A written personalized care plan for preventive services as well as general preventive health recommendations were provided to patient.     Barb Merino, LPN   [id number]   Nurse Notes: Questions answered by his brother Hessie Diener. 6 CIT not administered. Patient diagnosised with mental retardation. Brother says he would not be able to participate in the assessment.

## 2020-03-18 NOTE — Assessment & Plan Note (Signed)
Rechecking labs today. Await results. Treat as needed.  °

## 2020-03-18 NOTE — Assessment & Plan Note (Signed)
Under good control on current regimen. Continue current regimen. Continue to monitor. Call with any concerns. Refills given. Labs drawn today.   

## 2020-03-18 NOTE — Progress Notes (Signed)
BP 121/73   Pulse 76   Temp 98.3 F (36.8 C) (Oral)   Ht 5' 7.91" (1.725 m)   Wt 180 lb 3.2 oz (81.7 kg)   SpO2 98%   BMI 27.47 kg/m    Subjective:    Patient ID: Bryan West, male    DOB: [birth year], 59 y.o.   MRN: [id number]  HPI: Bryan West is a 59 y.o. male  Chief Complaint  Patient presents with  . Hypertension   HYPERTENSION / HYPERLIPIDEMIA Satisfied with current treatment? yes Duration of hypertension: chronic BP monitoring frequency: not checking BP medication side effects: no Past BP meds: HCTZ, losartan Duration of hyperlipidemia: chronic Cholesterol medication side effects: not on anything Cholesterol supplements: none Past cholesterol medications: none Medication compliance: excellent compliance Aspirin: no Recent stressors: no Recurrent headaches: no Visual changes: no Palpitations: no Dyspnea: no Chest pain: no Lower extremity edema: no Dizzy/lightheaded: no  Relevant past medical, surgical, family and social history reviewed and updated as indicated. Interim medical history since our last visit reviewed. Allergies and medications reviewed and updated.  Review of Systems  Constitutional: Negative.   HENT: Negative.   Eyes: Negative.   Respiratory: Negative.   Cardiovascular: Negative.   Gastrointestinal: Negative.   Endocrine: Negative.   Genitourinary: Negative.   Musculoskeletal: Negative.   Skin: Negative.   Allergic/Immunologic: Negative.   Neurological: Negative.   Hematological: Negative.   Psychiatric/Behavioral: Negative.     Per HPI unless specifically indicated above     Objective:    BP 121/73   Pulse 76   Temp 98.3 F (36.8 C) (Oral)   Ht 5' 7.91" (1.725 m)   Wt 180 lb 3.2 oz (81.7 kg)   SpO2 98%   BMI 27.47 kg/m   Wt Readings from Last 3 Encounters:  03/18/20 180 lb 3.2 oz (81.7 kg)  03/18/20 180 lb (81.6 kg)  08/28/19 175 lb 6.4 oz (79.6 kg)    Physical Exam Vitals and nursing note reviewed.    Constitutional:      General: He is not in acute distress.    Appearance: Normal appearance. He is obese. He is not ill-appearing, toxic-appearing or diaphoretic.  HENT:     Head: Normocephalic and atraumatic.     Right Ear: Tympanic membrane, ear canal and external ear normal. There is no impacted cerumen.     Left Ear: Tympanic membrane, ear canal and external ear normal. There is no impacted cerumen.     Nose: Nose normal. No congestion or rhinorrhea.     Mouth/Throat:     Mouth: Mucous membranes are moist.     Pharynx: Oropharynx is clear. No oropharyngeal exudate or posterior oropharyngeal erythema.  Eyes:     General: No scleral icterus.       Right eye: No discharge.        Left eye: No discharge.     Extraocular Movements: Extraocular movements intact.     Conjunctiva/sclera: Conjunctivae normal.     Pupils: Pupils are equal, round, and reactive to light.  Neck:     Vascular: No carotid bruit.  Cardiovascular:     Rate and Rhythm: Normal rate and regular rhythm.     Pulses: Normal pulses.     Heart sounds: No murmur heard.  No friction rub. No gallop.   Pulmonary:     Effort: Pulmonary effort is normal. No respiratory distress.     Breath sounds: Normal breath sounds. No stridor. No wheezing, rhonchi or rales.  Chest:     Chest wall: No tenderness.  Abdominal:     General: Abdomen is flat. Bowel sounds are normal. There is no distension.     Palpations: Abdomen is soft. There is no mass.     Tenderness: There is no abdominal tenderness. There is no right CVA tenderness, left CVA tenderness, guarding or rebound.     Hernia: No hernia is present.  Genitourinary:    Comments: Genital exam deferred with shared decision making Musculoskeletal:        General: No swelling, tenderness, deformity or signs of injury.     Cervical back: Normal range of motion and neck supple. No rigidity. No muscular tenderness.     Right lower leg: No edema.     Left lower leg: No edema.   Lymphadenopathy:     Cervical: No cervical adenopathy.  Skin:    General: Skin is warm and dry.     Capillary Refill: Capillary refill takes less than 2 seconds.     Coloration: Skin is not jaundiced or pale.     Findings: No bruising, erythema, lesion or rash.  Neurological:     General: No focal deficit present.     Mental Status: He is alert and oriented to person, place, and time.     Cranial Nerves: No cranial nerve deficit.     Sensory: No sensory deficit.     Motor: No weakness.     Coordination: Coordination normal.     Gait: Gait normal.     Deep Tendon Reflexes: Reflexes normal.  Psychiatric:        Mood and Affect: Mood normal.        Behavior: Behavior normal.        Thought Content: Thought content normal.        Judgment: Judgment normal.     Results for orders placed or performed in visit on 08/28/19  Comprehensive metabolic panel  Result Value Ref Range   Glucose 117 (H) 65 - 99 mg/dL   BUN 12 8 - 27 mg/dL   Creatinine, Ser 1.94 0.76 - 1.27 mg/dL   GFR calc non Af Amer 86 >59 mL/min/1.73   GFR calc Af Amer 99 >59 mL/min/1.73   BUN/Creatinine Ratio 13 10 - 24   Sodium 138 134 - 144 mmol/L   Potassium 4.0 3.5 - 5.2 mmol/L   Chloride 98 96 - 106 mmol/L   CO2 28 20 - 29 mmol/L   Calcium 9.4 8.6 - 10.2 mg/dL   Total Protein 6.9 6.0 - 8.5 g/dL   Albumin 4.1 3.8 - 4.9 g/dL   Globulin, Total 2.8 1.5 - 4.5 g/dL   Albumin/Globulin Ratio 1.5 1.2 - 2.2   Bilirubin Total 1.2 0.0 - 1.2 mg/dL   Alkaline Phosphatase 94 48 - 121 IU/L   AST 6 0 - 40 IU/L   ALT 15 0 - 44 IU/L  Lipid Panel w/o Chol/HDL Ratio  Result Value Ref Range   Cholesterol, Total 211 (H) 100 - 199 mg/dL   Triglycerides 174 (H) 0 - 149 mg/dL   HDL 47 >08 mg/dL   VLDL Cholesterol Cal 34 5 - 40 mg/dL   LDL Chol Calc (NIH) 144 (H) 0 - 99 mg/dL      Assessment & Plan:   Problem List Items Addressed This Visit      Cardiovascular and Mediastinum   Hypertension - Primary    Under good control  on current regimen. Continue current regimen. Continue to  monitor. Call with any concerns. Refills given. Labs drawn today.       Relevant Medications   hydrochlorothiazide (MICROZIDE) 12.5 MG capsule   losartan (COZAAR) 50 MG tablet   Other Relevant Orders   CBC with Differential/Platelet   Comprehensive metabolic panel   Microalbumin, Urine Waived   TSH   Urinalysis, Routine w reflex microscopic     Other   Hyperlipidemia    Rechecking labs today. Await results. Treat as needed.       Relevant Medications   hydrochlorothiazide (MICROZIDE) 12.5 MG capsule   losartan (COZAAR) 50 MG tablet   Other Relevant Orders   CBC with Differential/Platelet   Comprehensive metabolic panel   Lipid Panel w/o Chol/HDL Ratio   Lymphocytosis    Rechecking labs today. Await results. Treat as needed.       Relevant Orders   CBC with Differential/Platelet   Comprehensive metabolic panel    Other Visit Diagnoses    Hesitancy       Labs drawn today. Await results.    Relevant Orders   PSA   Need for influenza vaccination       Flu shot given today.    Relevant Orders   Flu Vaccine QUAD 6+ mos PF IM (Fluarix Quad PF) (Completed)   Pyuria       Will check urine culture. Await results.    Relevant Orders   Urine Culture       Follow up plan: Return in about 6 months (around 09/16/2020).

## 2020-03-18 NOTE — Patient Instructions (Signed)
[name] , Thank you for taking time to come for your Medicare Wellness Visit. I appreciate your ongoing commitment to your health goals. Please review the following plan we discussed and let me know if I can assist you in the future.   Screening recommendations/referrals: Colonoscopy: brother declines Recommended yearly ophthalmology/optometry visit for glaucoma screening and checkup Recommended yearly dental visit for hygiene and checkup  Vaccinations: Influenza vaccine: at today's appointment in office Pneumococcal vaccine: n/a Tdap vaccine: completed 06/17/2017, due 06/18/2027 Shingles vaccine: discussed   Covid-19: decline  Advanced directives: Advance directive discussed with you today..   Conditions/risks identified: none  Next appointment: Follow up in one year for your annual wellness visit   Preventive Care 40-64 Years, Male Preventive care refers to lifestyle choices and visits with your health care provider that can promote health and wellness. What does preventive care include?  A yearly physical exam. This is also called an annual well check.  Dental exams once or twice a year.  Routine eye exams. Ask your health care provider how often you should have your eyes checked.  Personal lifestyle choices, including:  Daily care of your teeth and gums.  Regular physical activity.  Eating a healthy diet.  Avoiding tobacco and drug use.  Limiting alcohol use.  Practicing safe sex.  Taking low-dose aspirin every day starting at age 37. What happens during an annual well check? The services and screenings done by your health care provider during your annual well check will depend on your age, overall health, lifestyle risk factors, and family history of disease. Counseling  Your health care provider may ask you questions about your:  Alcohol use.  Tobacco use.  Drug use.  Emotional well-being.  Home and relationship well-being.  Sexual activity.  Eating  habits.  Work and work Astronomer. Screening  You may have the following tests or measurements:  Height, weight, and BMI.  Blood pressure.  Lipid and cholesterol levels. These may be checked every 5 years, or more frequently if you are over 42 years old.  Skin check.  Lung cancer screening. You may have this screening every year starting at age 61 if you have a 30-pack-year history of smoking and currently smoke or have quit within the past 15 years.  Fecal occult blood test (FOBT) of the stool. You may have this test every year starting at age 31.  Flexible sigmoidoscopy or colonoscopy. You may have a sigmoidoscopy every 5 years or a colonoscopy every 10 years starting at age 55.  Prostate cancer screening. Recommendations will vary depending on your family history and other risks.  Hepatitis C blood test.  Hepatitis B blood test.  Sexually transmitted disease (STD) testing.  Diabetes screening. This is done by checking your blood sugar (glucose) after you have not eaten for a while (fasting). You may have this done every 1-3 years. Discuss your test results, treatment options, and if necessary, the need for more tests with your health care provider. Vaccines  Your health care provider may recommend certain vaccines, such as:  Influenza vaccine. This is recommended every year.  Tetanus, diphtheria, and acellular pertussis (Tdap, Td) vaccine. You may need a Td booster every 10 years.  Zoster vaccine. You may need this after age 41.  Pneumococcal 13-valent conjugate (PCV13) vaccine. You may need this if you have certain conditions and have not been vaccinated.  Pneumococcal polysaccharide (PPSV23) vaccine. You may need one or two doses if you smoke cigarettes or if you have certain  conditions. Talk to your health care provider about which screenings and vaccines you need and how often you need them. This information is not intended to replace advice given to you by your  health care provider. Make sure you discuss any questions you have with your health care provider. Document Released: 04/26/2015 Document Revised: 12/18/2015 Document Reviewed: 01/29/2015 Elsevier Interactive Patient Education  2017 [employer].  Fall Prevention in the Home Falls can cause injuries. They can happen to people of all ages. There are many things you can do to make your home safe and to help prevent falls. What can I do on the outside of my home?  Regularly fix the edges of walkways and driveways and fix any cracks.  Remove anything that might make you trip as you walk through a door, such as a raised step or threshold.  Trim any bushes or trees on the path to your home.  Use bright outdoor lighting.  Clear any walking paths of anything that might make someone trip, such as rocks or tools.  Regularly check to see if handrails are loose or broken. Make sure that both sides of any steps have handrails.  Any raised decks and porches should have guardrails on the edges.  Have any leaves, snow, or ice cleared regularly.  Use sand or salt on walking paths during winter.  Clean up any spills in your garage right away. This includes oil or grease spills. What can I do in the bathroom?  Use night lights.  Install grab bars by the toilet and in the tub and shower. Do not use towel bars as grab bars.  Use non-skid mats or decals in the tub or shower.  If you need to sit down in the shower, use a plastic, non-slip stool.  Keep the floor dry. Clean up any water that spills on the floor as soon as it happens.  Remove soap buildup in the tub or shower regularly.  Attach bath mats securely with double-sided non-slip rug tape.  Do not have throw rugs and other things on the floor that can make you trip. What can I do in the bedroom?  Use night lights.  Make sure that you have a light by your bed that is easy to reach.  Do not use any sheets or blankets that are too big  for your bed. They should not hang down onto the floor.  Have a firm chair that has side arms. You can use this for support while you get dressed.  Do not have throw rugs and other things on the floor that can make you trip. What can I do in the kitchen?  Clean up any spills right away.  Avoid walking on wet floors.  Keep items that you use a lot in easy-to-reach places.  If you need to reach something above you, use a strong step stool that has a grab bar.  Keep electrical cords out of the way.  Do not use floor polish or wax that makes floors slippery. If you must use wax, use non-skid floor wax.  Do not have throw rugs and other things on the floor that can make you trip. What can I do with my stairs?  Do not leave any items on the stairs.  Make sure that there are handrails on both sides of the stairs and use them. Fix handrails that are broken or loose. Make sure that handrails are as long as the stairways.  Check any carpeting to make  sure that it is firmly attached to the stairs. Fix any carpet that is loose or worn.  Avoid having throw rugs at the top or bottom of the stairs. If you do have throw rugs, attach them to the floor with carpet tape.  Make sure that you have a light switch at the top of the stairs and the bottom of the stairs. If you do not have them, ask someone to add them for you. What else can I do to help prevent falls?  Wear shoes that:  Do not have high heels.  Have rubber bottoms.  Are comfortable and fit you well.  Are closed at the toe. Do not wear sandals.  If you use a stepladder:  Make sure that it is fully opened. Do not climb a closed stepladder.  Make sure that both sides of the stepladder are locked into place.  Ask someone to hold it for you, if possible.  Clearly mark and make sure that you can see:  Any grab bars or handrails.  First and last steps.  Where the edge of each step is.  Use tools that help you move around  (mobility aids) if they are needed. These include:  Canes.  Walkers.  Scooters.  Crutches.  Turn on the lights when you go into a dark area. Replace any light bulbs as soon as they burn out.  Set up your furniture so you have a clear path. Avoid moving your furniture around.  If any of your floors are uneven, fix them.  If there are any pets around you, be aware of where they are.  Review your medicines with your doctor. Some medicines can make you feel dizzy. This can increase your chance of falling. Ask your doctor what other things that you can do to help prevent falls. This information is not intended to replace advice given to you by your health care provider. Make sure you discuss any questions you have with your health care provider. Document Released: 01/24/2009 Document Revised: 09/05/2015 Document Reviewed: 05/04/2014 Elsevier Interactive Patient Education  2017 [employer].

## 2020-03-19 ENCOUNTER — Encounter: Payer: Self-pay | Admitting: Family Medicine

## 2020-03-19 LAB — COMPREHENSIVE METABOLIC PANEL
ALT: 20 IU/L (ref 0–44)
AST: 9 IU/L (ref 0–40)
Albumin/Globulin Ratio: 1.4 (ref 1.2–2.2)
Albumin: 4.1 g/dL (ref 3.8–4.9)
Alkaline Phosphatase: 92 IU/L (ref 44–121)
BUN/Creatinine Ratio: 14 (ref 10–24)
BUN: 12 mg/dL (ref 8–27)
Bilirubin Total: 0.9 mg/dL (ref 0.0–1.2)
CO2: 26 mmol/L (ref 20–29)
Calcium: 9.4 mg/dL (ref 8.6–10.2)
Chloride: 101 mmol/L (ref 96–106)
Creatinine, Ser: 0.85 mg/dL (ref 0.76–1.27)
GFR calc Af Amer: 109 mL/min/{1.73_m2} (ref >59)
GFR calc non Af Amer: 95 mL/min/{1.73_m2} (ref >59)
Globulin, Total: 3 g/dL (ref 1.5–4.5)
Glucose: 94 mg/dL (ref 65–99)
Potassium: 4 mmol/L (ref 3.5–5.2)
Sodium: 139 mmol/L (ref 134–144)
Total Protein: 7.1 g/dL (ref 6.0–8.5)

## 2020-03-19 LAB — CBC WITH DIFFERENTIAL/PLATELET
Basophils Absolute: 0.1 10*3/uL (ref 0.0–0.2)
Basos: 1 %
EOS (ABSOLUTE): 0.2 10*3/uL (ref 0.0–0.4)
Eos: 3 %
Hematocrit: 44 % (ref 37.5–51.0)
Hemoglobin: 15.1 g/dL (ref 13.0–17.7)
Immature Grans (Abs): 0 10*3/uL (ref 0.0–0.1)
Immature Granulocytes: 1 %
Lymphocytes Absolute: 2.8 10*3/uL (ref 0.7–3.1)
Lymphs: 39 %
MCH: 31.5 pg (ref 26.6–33.0)
MCHC: 34.3 g/dL (ref 31.5–35.7)
MCV: 92 fL (ref 79–97)
Monocytes Absolute: 0.6 10*3/uL (ref 0.1–0.9)
Monocytes: 9 %
Neutrophils Absolute: 3.4 10*3/uL (ref 1.4–7.0)
Neutrophils: 47 %
Platelets: 191 10*3/uL (ref 150–450)
RBC: 4.8 x10E6/uL (ref 4.14–5.80)
RDW: 13.1 % (ref 11.6–15.4)
WBC: 7.1 10*3/uL (ref 3.4–10.8)

## 2020-03-19 LAB — LIPID PANEL W/O CHOL/HDL RATIO
Cholesterol, Total: 224 mg/dL — ABNORMAL HIGH (ref 100–199)
HDL: 48 mg/dL (ref >39)
LDL Chol Calc (NIH): 134 mg/dL — ABNORMAL HIGH (ref 0–99)
Triglycerides: 236 mg/dL — ABNORMAL HIGH (ref 0–149)
VLDL Cholesterol Cal: 42 mg/dL — ABNORMAL HIGH (ref 5–40)

## 2020-03-19 LAB — TSH: TSH: 1.47 u[IU]/mL (ref 0.450–4.500)

## 2020-03-19 LAB — PSA: Prostate Specific Ag, Serum: 2.5 ng/mL (ref 0.0–4.0)

## 2020-03-21 LAB — URINE CULTURE

## 2020-03-22 ENCOUNTER — Other Ambulatory Visit: Payer: Self-pay | Admitting: Family Medicine

## 2020-03-22 MED ORDER — NITROFURANTOIN MONOHYD MACRO 100 MG PO CAPS: 100.0000 mg | ORAL_CAPSULE | Freq: Two times a day (BID) | ORAL | 0 refills | Status: DC

## 2020-11-29 DIAGNOSIS — H6063 Unspecified chronic otitis externa, bilateral: Secondary | ICD-10-CM | POA: Diagnosis not present

## 2020-11-29 DIAGNOSIS — H6123 Impacted cerumen, bilateral: Secondary | ICD-10-CM | POA: Diagnosis not present

## 2020-12-19 ENCOUNTER — Ambulatory Visit (INDEPENDENT_AMBULATORY_CARE_PROVIDER_SITE_OTHER): Payer: Medicare Other | Admitting: Dermatology

## 2020-12-19 ENCOUNTER — Other Ambulatory Visit: Payer: Self-pay

## 2020-12-19 DIAGNOSIS — L409 Psoriasis, unspecified: Secondary | ICD-10-CM

## 2020-12-19 DIAGNOSIS — L578 Other skin changes due to chronic exposure to nonionizing radiation: Secondary | ICD-10-CM

## 2020-12-19 DIAGNOSIS — L570 Actinic keratosis: Secondary | ICD-10-CM | POA: Diagnosis not present

## 2020-12-19 MED ORDER — KETOCONAZOLE 2 % EX SHAM: 1.0000 "application " | MEDICATED_SHAMPOO | CUTANEOUS | 11 refills | Status: DC

## 2020-12-19 MED ORDER — MOMETASONE FUROATE 0.1 % EX SOLN: CUTANEOUS | 0 refills | Status: DC

## 2020-12-19 MED ORDER — MOMETASONE FUROATE 0.1 % EX CREA: 1.0000 "application " | TOPICAL_CREAM | CUTANEOUS | 4 refills | Status: DC

## 2020-12-19 MED ORDER — FLUOROURACIL 5 % EX CREA: TOPICAL_CREAM | Freq: Two times a day (BID) | CUTANEOUS | 0 refills | Status: DC

## 2020-12-19 NOTE — Progress Notes (Signed)
Follow-Up Visit   Subjective  Bryan West is a 60 y.o. male who presents for the following: Psoriasis (Pt accompanied by brother. Brother states that pt needs refills of psoriasis medication. States that pt is doing well with the meds. ). Patient is mentally challenged.  He is present with his brother who contributes to history.  The following portions of the chart were reviewed this encounter and updated as appropriate:  Tobacco  Allergies  Meds  Problems  Med Hx  Surg Hx  Fam Hx     Review of Systems: No other skin or systemic complaints except as noted in HPI or Assessment and Plan.  Objective  Well appearing patient in no apparent distress; mood and affect are within normal limits.  A focused examination was performed including face, hands, scalp. Relevant physical exam findings are noted in the Assessment and Plan.  Scalp Well-demarcated erythematous papules/plaques with silvery scale, guttate pink scaly papules.   face and ears x 17, right hand x 1, left hand x 4 (22) Erythematous thin papules/macules with gritty scale.   Assessment & Plan  Psoriasis /SEBO psoriasis Scalp Psoriasis and sebopsoriasis controlled on ketoconazole and mometasone  Psoriasis is a chronic non-curable, but treatable genetic/hereditary disease that may have other systemic features affecting other organ systems such as joints (Psoriatic Arthritis). It is associated with an increased risk of inflammatory bowel disease, heart disease, non-alcoholic fatty liver disease, and depression.    mometasone (ELOCON) 0.1 % lotion - Scalp Apply once a day up to 5d/wk aa scalp prn flares  Related Medications ketoconazole (NIZORAL) 2 % shampoo Apply 1 application topically 3 (three) times a week. Wash scalp 3 times weekly, let sit 5 minutes before rinsing out  mometasone (ELOCON) 0.1 % cream Apply 1 application topically as directed. Qd up to 5 days a week aa psoriasis on buttocks or body prn flares  AK  (actinic keratosis) (22) face and ears x 17, right hand x 1, left hand x 4  Actinic keratoses are precancerous spots that appear secondary to cumulative UV radiation exposure/sun exposure over time. They are chronic with expected duration over 1 year. A portion of actinic keratoses will progress to squamous cell carcinoma of the skin. It is not possible to reliably predict which spots will progress to skin cancer and so treatment is recommended to prevent development of skin cancer.  Recommend daily broad spectrum sunscreen SPF 30+ to sun-exposed areas, reapply every 2 hours as needed.  Recommend staying in the shade or wearing long sleeves, sun glasses (UVA+UVB protection) and wide brim hats (4-inch brim around the entire circumference of the hat). Call for new or changing lesions.  Prior to procedure, discussed risks of blister formation, small wound, skin dyspigmentation, or rare scar following cryotherapy. Recommend Vaseline ointment to treated areas while healing.  Start 61fu/calcipotriene cream in early November. Apply to temples,  cheeks, and tops of ears BID x 1 week.  Sent to [facility].   Destruction of lesion - face and ears x 17, right hand x 1, left hand x 4 Complexity: simple   Destruction method: cryotherapy   Informed consent: discussed and consent obtained   Timeout:  patient name, date of birth, surgical site, and procedure verified Lesion destroyed using liquid nitrogen: Yes   Region frozen until ice ball extended beyond lesion: Yes   Outcome: patient tolerated procedure well with no complications   Post-procedure details: wound care instructions given    Actinic Damage - Severe, confluent actinic  changes with pre-cancerous actinic keratoses  - Severe, chronic, not at goal, secondary to cumulative UV radiation exposure over time - diffuse scaly erythematous macules and papules with underlying dyspigmentation - Discussed Prescription "Field Treatment" for Severe, Chronic  Confluent Actinic Changes with Pre-Cancerous Actinic Keratoses Field treatment involves treatment of an entire area of skin that has confluent Actinic Changes (Sun/ Ultraviolet light damage) and PreCancerous Actinic Keratoses by method of PhotoDynamic Therapy (PDT) and/or prescription Topical Chemotherapy agents such as 5-fluorouracil, 5-fluorouracil/calcipotriene, and/or imiquimod.  The purpose is to decrease the number of clinically evident and subclinical PreCancerous lesions to prevent progression to development of skin cancer by chemically destroying early precancer changes that may or may not be visible.  It has been shown to reduce the risk of developing skin cancer in the treated area. As a result of treatment, redness, scaling, crusting, and open sores may occur during treatment course. One or more than one of these methods may be used and may have to be used several times to control, suppress and eliminate the PreCancerous changes. Discussed treatment course, expected reaction, and possible side effects. - Recommend daily broad spectrum sunscreen SPF 30+ to sun-exposed areas, reapply every 2 hours as needed.  - Staying in the shade or wearing long sleeves, sun glasses (UVA+UVB protection) and wide brim hats (4-inch brim around the entire circumference of the hat) are also recommended. - Call for new or changing lesions.  Related Medications fluorouracil (EFUDEX) 5 % cream Apply topically 2 (two) times daily. In early November - Apply to temples, cheeks, and tops of ears twice a day x 1 week  Return in about 6 months (around 06/18/2021) for 5-6 mo AK f/u.  IEpifania Gore, CMA, am acting as scribe for Armida Sans, MD. Documentation: I have reviewed the above documentation for accuracy and completeness, and I agree with the above.  Armida Sans, MD

## 2020-12-19 NOTE — Patient Instructions (Signed)
Start 36fu/calcipotriene cream in early November. Apply to temples,  cheeks, and tops of ears BID x 1 week.  Sent to [facility].

## 2020-12-20 ENCOUNTER — Other Ambulatory Visit: Payer: Self-pay | Admitting: Family Medicine

## 2020-12-24 ENCOUNTER — Encounter: Payer: Self-pay | Admitting: Dermatology

## 2021-01-09 ENCOUNTER — Other Ambulatory Visit: Payer: Self-pay

## 2021-01-09 ENCOUNTER — Ambulatory Visit (INDEPENDENT_AMBULATORY_CARE_PROVIDER_SITE_OTHER): Payer: Medicare Other | Admitting: Family Medicine

## 2021-01-09 ENCOUNTER — Encounter: Payer: Self-pay | Admitting: Family Medicine

## 2021-01-09 VITALS — BP 132/66 | HR 65 | Ht 67.0 in | Wt 180.0 lb

## 2021-01-09 DIAGNOSIS — I10 Essential (primary) hypertension: Secondary | ICD-10-CM

## 2021-01-09 DIAGNOSIS — Z23 Encounter for immunization: Secondary | ICD-10-CM | POA: Diagnosis not present

## 2021-01-09 DIAGNOSIS — Z1211 Encounter for screening for malignant neoplasm of colon: Secondary | ICD-10-CM

## 2021-01-09 DIAGNOSIS — E782 Mixed hyperlipidemia: Secondary | ICD-10-CM

## 2021-01-09 MED ORDER — HYDROCHLOROTHIAZIDE 12.5 MG PO CAPS: 12.5000 mg | ORAL_CAPSULE | Freq: Every day | ORAL | 1 refills | Status: DC

## 2021-01-09 MED ORDER — LOSARTAN POTASSIUM 50 MG PO TABS: 50.0000 mg | ORAL_TABLET | Freq: Every day | ORAL | 1 refills | Status: DC

## 2021-01-09 NOTE — Progress Notes (Signed)
BP 132/66   Pulse 65   Ht 5\' 7"  (1.702 m)   Wt 180 lb (81.6 kg)   BMI 28.19 kg/m    Subjective:    Patient ID: , male    DOB: 12/05/1960, 61 y.o.   MRN: [id number]  HPI: Bryan West is a 60 y.o. male  Chief Complaint  Patient presents with   Hypertension   HYPERTENSION / HYPERLIPIDEMIA Satisfied with current treatment? yes Duration of hypertension: chronic BP monitoring frequency: not checking BP medication side effects: no Past BP meds: losartan, HCTZ Duration of hyperlipidemia: chronic Cholesterol medication side effects: no Cholesterol supplements: none Past cholesterol medications: none Medication compliance: excellent compliance Aspirin: no Recent stressors: no Recurrent headaches: no Visual changes: no Palpitations: no Dyspnea: no Chest pain: no Lower extremity edema: no Dizzy/lightheaded: no  Relevant past medical, surgical, family and social history reviewed and updated as indicated. Interim medical history since our last visit reviewed. Allergies and medications reviewed and updated.  Review of Systems  Constitutional: Negative.   Respiratory: Negative.    Cardiovascular: Negative.   Gastrointestinal: Negative.   Musculoskeletal: Negative.   Psychiatric/Behavioral: Negative.     Per HPI unless specifically indicated above     Objective:    BP 132/66   Pulse 65   Ht 5\' 7"  (1.702 m)   Wt 180 lb (81.6 kg)   BMI 28.19 kg/m   Wt Readings from Last 3 Encounters:  01/09/21 180 lb (81.6 kg)  03/18/20 180 lb 3.2 oz (81.7 kg)  03/18/20 180 lb (81.6 kg)    Physical Exam Vitals and nursing note reviewed.  Constitutional:      General: He is not in acute distress.    Appearance: Normal appearance. He is not ill-appearing, toxic-appearing or diaphoretic.  HENT:     Head: Normocephalic and atraumatic.     Right Ear: External ear normal.     Left Ear: External ear normal.     Nose: Nose normal.     Mouth/Throat:     Mouth: Mucous  membranes are moist.     Pharynx: Oropharynx is clear.  Eyes:     General: No scleral icterus.       Right eye: No discharge.        Left eye: No discharge.     Extraocular Movements: Extraocular movements intact.     Conjunctiva/sclera: Conjunctivae normal.     Pupils: Pupils are equal, round, and reactive to light.  Cardiovascular:     Rate and Rhythm: Normal rate and regular rhythm.     Pulses: Normal pulses.     Heart sounds: Normal heart sounds. No murmur heard.   No friction rub. No gallop.  Pulmonary:     Effort: Pulmonary effort is normal. No respiratory distress.     Breath sounds: Normal breath sounds. No stridor. No wheezing, rhonchi or rales.  Chest:     Chest wall: No tenderness.  Musculoskeletal:        General: Normal range of motion.     Cervical back: Normal range of motion and neck supple.  Skin:    General: Skin is warm and dry.     Capillary Refill: Capillary refill takes less than 2 seconds.     Coloration: Skin is not jaundiced or pale.     Findings: No bruising, erythema, lesion or rash.  Neurological:     General: No focal deficit present.     Mental Status: He is alert and oriented  to person, place, and time. Mental status is at baseline.  Psychiatric:        Mood and Affect: Mood normal.        Behavior: Behavior normal.        Thought Content: Thought content normal.        Judgment: Judgment normal.    Results for orders placed or performed in visit on 03/18/20  Urine Culture   Specimen: Urine   UR  Result Value Ref Range   Urine Culture, Routine Final report (A)    Organism ID, Bacteria Escherichia coli (A)    Antimicrobial Susceptibility Comment   Microscopic Examination   Urine  Result Value Ref Range   WBC, UA 11-30 (A) 0 - 5 /hpf   RBC 0-2 0 - 2 /hpf   Epithelial Cells (non renal) 0-10 0 - 10 /hpf   Bacteria, UA Many (A) None seen/Few  CBC with Differential/Platelet  Result Value Ref Range   WBC 7.1 3.4 - 10.8 x10E3/uL   RBC 4.80  4.14 - 5.80 x10E6/uL   Hemoglobin 15.1 13.0 - 17.7 g/dL   Hematocrit 95.2 84.1 - 51.0 %   MCV 92 79 - 97 fL   MCH 31.5 26.6 - 33.0 pg   MCHC 34.3 31.5 - 35.7 g/dL   RDW 32.4 40.1 - 02.7 %   Platelets 191 150 - 450 x10E3/uL   Neutrophils 47 Not Estab. %   Lymphs 39 Not Estab. %   Monocytes 9 Not Estab. %   Eos 3 Not Estab. %   Basos 1 Not Estab. %   Neutrophils Absolute 3.4 1.4 - 7.0 x10E3/uL   Lymphocytes Absolute 2.8 0.7 - 3.1 x10E3/uL   Monocytes Absolute 0.6 0.1 - 0.9 x10E3/uL   EOS (ABSOLUTE) 0.2 0.0 - 0.4 x10E3/uL   Basophils Absolute 0.1 0.0 - 0.2 x10E3/uL   Immature Granulocytes 1 Not Estab. %   Immature Grans (Abs) 0.0 0.0 - 0.1 x10E3/uL  Comprehensive metabolic panel  Result Value Ref Range   Glucose 94 65 - 99 mg/dL   BUN 12 8 - 27 mg/dL   Creatinine, Ser 2.53 0.76 - 1.27 mg/dL   GFR calc non Af Amer 95 >59 mL/min/1.73   GFR calc Af Amer 109 >59 mL/min/1.73   BUN/Creatinine Ratio 14 10 - 24   Sodium 139 134 - 144 mmol/L   Potassium 4.0 3.5 - 5.2 mmol/L   Chloride 101 96 - 106 mmol/L   CO2 26 20 - 29 mmol/L   Calcium 9.4 8.6 - 10.2 mg/dL   Total Protein 7.1 6.0 - 8.5 g/dL   Albumin 4.1 3.8 - 4.9 g/dL   Globulin, Total 3.0 1.5 - 4.5 g/dL   Albumin/Globulin Ratio 1.4 1.2 - 2.2   Bilirubin Total 0.9 0.0 - 1.2 mg/dL   Alkaline Phosphatase 92 44 - 121 IU/L   AST 9 0 - 40 IU/L   ALT 20 0 - 44 IU/L  Lipid Panel w/o Chol/HDL Ratio  Result Value Ref Range   Cholesterol, Total 224 (H) 100 - 199 mg/dL   Triglycerides 664 (H) 0 - 149 mg/dL   HDL 48 >40 mg/dL   VLDL Cholesterol Cal 42 (H) 5 - 40 mg/dL   LDL Chol Calc (NIH) 347 (H) 0 - 99 mg/dL  Microalbumin, Urine Waived  Result Value Ref Range   Microalb, Ur Waived 10 0 - 19 mg/L   Creatinine, Urine Waived 100 10 - 300 mg/dL   Microalb/Creat Ratio <30 <30 mg/g  PSA  Result Value Ref Range   Prostate Specific Ag, Serum 2.5 0.0 - 4.0 ng/mL  TSH  Result Value Ref Range   TSH 1.470 0.450 - 4.500 uIU/mL  Urinalysis,  Routine w reflex microscopic  Result Value Ref Range   Specific Gravity, UA 1.020 1.005 - 1.030   pH, UA 6.0 5.0 - 7.5   Color, UA Yellow Yellow   Appearance Ur Cloudy (A) Clear   Leukocytes,UA 2+ (A) Negative   Protein,UA Negative Negative/Trace   Glucose, UA Negative Negative   Ketones, UA Negative Negative   RBC, UA Negative Negative   Bilirubin, UA Negative Negative   Urobilinogen, Ur 0.2 0.2 - 1.0 mg/dL   Nitrite, UA Positive (A) Negative   Microscopic Examination See below:       Assessment & Plan:   Problem List Items Addressed This Visit       Cardiovascular and Mediastinum   Hypertension    Under good control on current regimen. Continue current regimen. Continue to monitor. Call with any concerns. Refills given. Labs drawn today.       Relevant Medications   hydrochlorothiazide (MICROZIDE) 12.5 MG capsule   losartan (COZAAR) 50 MG tablet   Other Relevant Orders   Comprehensive metabolic panel     Other   Hyperlipidemia - Primary    Rechecking labs today. Await results. Treat as needed.       Relevant Medications   hydrochlorothiazide (MICROZIDE) 12.5 MG capsule   losartan (COZAAR) 50 MG tablet   Other Relevant Orders   Comprehensive metabolic panel   Lipid Panel w/o Chol/HDL Ratio   Other Visit Diagnoses     Screening for colon cancer       Cologuard ordered today.   Relevant Orders   Cologuard   Need for immunization against influenza       Relevant Orders   Flu Vaccine QUAD 57mo+IM (Fluarix, Fluzone & Alfiuria Quad PF) (Completed)        Follow up plan: Return in about 3 months (around 04/10/2021).

## 2021-01-09 NOTE — Assessment & Plan Note (Signed)
Rechecking labs today. Await results. Treat as needed.  °

## 2021-01-09 NOTE — Assessment & Plan Note (Signed)
Under good control on current regimen. Continue current regimen. Continue to monitor. Call with any concerns. Refills given. Labs drawn today.   

## 2021-01-10 ENCOUNTER — Encounter: Payer: Self-pay | Admitting: Family Medicine

## 2021-01-10 LAB — COMPREHENSIVE METABOLIC PANEL
ALT: 29 IU/L (ref 0–44)
AST: 11 IU/L (ref 0–40)
Albumin/Globulin Ratio: 1.4 (ref 1.2–2.2)
Albumin: 4.2 g/dL (ref 3.8–4.8)
Alkaline Phosphatase: 92 IU/L (ref 44–121)
BUN/Creatinine Ratio: 11 (ref 10–24)
BUN: 10 mg/dL (ref 8–27)
Bilirubin Total: 0.9 mg/dL (ref 0.0–1.2)
CO2: 26 mmol/L (ref 20–29)
Calcium: 9.3 mg/dL (ref 8.6–10.2)
Chloride: 102 mmol/L (ref 96–106)
Creatinine, Ser: 0.92 mg/dL (ref 0.76–1.27)
Globulin, Total: 2.9 g/dL (ref 1.5–4.5)
Glucose: 102 mg/dL — ABNORMAL HIGH (ref 70–99)
Potassium: 4 mmol/L (ref 3.5–5.2)
Sodium: 140 mmol/L (ref 134–144)
Total Protein: 7.1 g/dL (ref 6.0–8.5)
eGFR: 95 mL/min/{1.73_m2} (ref >59)

## 2021-01-10 LAB — LIPID PANEL W/O CHOL/HDL RATIO
Cholesterol, Total: 204 mg/dL — ABNORMAL HIGH (ref 100–199)
HDL: 45 mg/dL (ref >39)
LDL Chol Calc (NIH): 125 mg/dL — ABNORMAL HIGH (ref 0–99)
Triglycerides: 190 mg/dL — ABNORMAL HIGH (ref 0–149)
VLDL Cholesterol Cal: 34 mg/dL (ref 5–40)

## 2021-02-11 ENCOUNTER — Telehealth: Payer: Self-pay | Admitting: Family Medicine

## 2021-02-11 NOTE — Telephone Encounter (Signed)
Copied from [place] [id number]. Topic: General - Call Back - No Documentation >> Feb 11, 2021  2:22 PM Erick Blinks wrote: Reason for CRM: Pt's brother called to report that the patient has received a cologaurd kit that states it should not be used if you have diarrhea. The patient always has runny waste bc of his gallbladder condition. Please advise, pt's brother wants a call back   Best contact: [phone]

## 2021-02-12 NOTE — Telephone Encounter (Signed)
Since that is his normal that is OK

## 2021-02-12 NOTE — Telephone Encounter (Signed)
Spoke with patient and made aware of Dr.Johnson's recommendations. Patient states he just wanted to be sure as he was reading the instructions and wanted to ask to be sure. Patient verbalized understanding and has no further questions as this time.

## 2021-03-03 DIAGNOSIS — Z1211 Encounter for screening for malignant neoplasm of colon: Secondary | ICD-10-CM | POA: Diagnosis not present

## 2021-03-04 ENCOUNTER — Other Ambulatory Visit: Payer: Self-pay | Admitting: Dermatology

## 2021-03-10 LAB — COLOGUARD: COLOGUARD: NEGATIVE

## 2021-03-19 ENCOUNTER — Ambulatory Visit (INDEPENDENT_AMBULATORY_CARE_PROVIDER_SITE_OTHER): Payer: Medicare Other | Admitting: *Deleted

## 2021-03-19 DIAGNOSIS — Z0000 Encounter for general adult medical examination without abnormal findings: Secondary | ICD-10-CM

## 2021-03-19 NOTE — Patient Instructions (Signed)
Bryan West , Thank you for taking time to come for your Medicare Wellness Visit. I appreciate your ongoing commitment to your health goals. Please review the following plan we discussed and let me know if I can assist you in the future.   Screening recommendations/referrals: Colonoscopy: up to date Recommended yearly ophthalmology/optometry visit for glaucoma screening and checkup Recommended yearly dental visit for hygiene and checkup  Vaccinations: Influenza vaccine: Education provided Pneumococcal vaccine: not indicated Tdap vaccine: up to date Shingles vaccine: Education provided    Advanced directives: Education provided  Conditions/risks identified:   Next appointment: 07-10-2021 @ 1:00 [facility] 40-64 Years, Male Preventive care refers to lifestyle choices and visits with your health care provider that can promote health and wellness. What does preventive care include? A yearly physical exam. This is also called an annual well check. Dental exams once or twice a year. Routine eye exams. Ask your health care provider how often you should have your eyes checked. Personal lifestyle choices, including: Daily care of your teeth and gums. Regular physical activity. Eating a healthy diet. Avoiding tobacco and drug use. Limiting alcohol use. Practicing safe sex. Taking low-dose aspirin every day starting at age 65. What happens during an annual well check? The services and screenings done by your health care provider during your annual well check will depend on your age, overall health, lifestyle risk factors, and family history of disease. Counseling  Your health care provider may ask you questions about your: Alcohol use. Tobacco use. Drug use. Emotional well-being. Home and relationship well-being. Sexual activity. Eating habits. Work and work Astronomer. Screening  You may have the following tests or measurements: Height, weight, and BMI. Blood  pressure. Lipid and cholesterol levels. These may be checked every 5 years, or more frequently if you are over 68 years old. Skin check. Lung cancer screening. You may have this screening every year starting at age 58 if you have a 30-pack-year history of smoking and currently smoke or have quit within the past 15 years. Fecal occult blood test (FOBT) of the stool. You may have this test every year starting at age 72. Flexible sigmoidoscopy or colonoscopy. You may have a sigmoidoscopy every 5 years or a colonoscopy every 10 years starting at age 54. Prostate cancer screening. Recommendations will vary depending on your family history and other risks. Hepatitis C blood test. Hepatitis B blood test. Sexually transmitted disease (STD) testing. Diabetes screening. This is done by checking your blood sugar (glucose) after you have not eaten for a while (fasting). You may have this done every 1-3 years. Discuss your test results, treatment options, and if necessary, the need for more tests with your health care provider. Vaccines  Your health care provider may recommend certain vaccines, such as: Influenza vaccine. This is recommended every year. Tetanus, diphtheria, and acellular pertussis (Tdap, Td) vaccine. You may need a Td booster every 10 years. Zoster vaccine. You may need this after age 86. Pneumococcal 13-valent conjugate (PCV13) vaccine. You may need this if you have certain conditions and have not been vaccinated. Pneumococcal polysaccharide (PPSV23) vaccine. You may need one or two doses if you smoke cigarettes or if you have certain conditions. Talk to your health care provider about which screenings and vaccines you need and how often you need them. This information is not intended to replace advice given to you by your health care provider. Make sure you discuss any questions you have with your health care provider. Document Released:  04/26/2015 Document Revised: 12/18/2015 Document  Reviewed: 01/29/2015 Elsevier Interactive Patient Education  2017 [place] Prevention in the Home Falls can cause injuries. They can happen to people of all ages. There are many things you can do to make your home safe and to help prevent falls. What can I do on the outside of my home? Regularly fix the edges of walkways and driveways and fix any cracks. Remove anything that might make you trip as you walk through a door, such as a raised step or threshold. Trim any bushes or trees on the path to your home. Use bright outdoor lighting. Clear any walking paths of anything that might make someone trip, such as rocks or tools. Regularly check to see if handrails are loose or broken. Make sure that both sides of any steps have handrails. Any raised decks and porches should have guardrails on the edges. Have any leaves, snow, or ice cleared regularly. Use sand or salt on walking paths during winter. Clean up any spills in your garage right away. This includes oil or grease spills. What can I do in the bathroom? Use night lights. Install grab bars by the toilet and in the tub and shower. Do not use towel bars as grab bars. Use non-skid mats or decals in the tub or shower. If you need to sit down in the shower, use a plastic, non-slip stool. Keep the floor dry. Clean up any water that spills on the floor as soon as it happens. Remove soap buildup in the tub or shower regularly. Attach bath mats securely with double-sided non-slip rug tape. Do not have throw rugs and other things on the floor that can make you trip. What can I do in the bedroom? Use night lights. Make sure that you have a light by your bed that is easy to reach. Do not use any sheets or blankets that are too big for your bed. They should not hang down onto the floor. Have a firm chair that has side arms. You can use this for support while you get dressed. Do not have throw rugs and other things on the floor that can  make you trip. What can I do in the kitchen? Clean up any spills right away. Avoid walking on wet floors. Keep items that you use a lot in easy-to-reach places. If you need to reach something above you, use a strong step stool that has a grab bar. Keep electrical cords out of the way. Do not use floor polish or wax that makes floors slippery. If you must use wax, use non-skid floor wax. Do not have throw rugs and other things on the floor that can make you trip. What can I do with my stairs? Do not leave any items on the stairs. Make sure that there are handrails on both sides of the stairs and use them. Fix handrails that are broken or loose. Make sure that handrails are as long as the stairways. Check any carpeting to make sure that it is firmly attached to the stairs. Fix any carpet that is loose or worn. Avoid having throw rugs at the top or bottom of the stairs. If you do have throw rugs, attach them to the floor with carpet tape. Make sure that you have a light switch at the top of the stairs and the bottom of the stairs. If you do not have them, ask someone to add them for you. What else can I do to help prevent  falls? Wear shoes that: Do not have high heels. Have rubber bottoms. Are comfortable and fit you well. Are closed at the toe. Do not wear sandals. If you use a stepladder: Make sure that it is fully opened. Do not climb a closed stepladder. Make sure that both sides of the stepladder are locked into place. Ask someone to hold it for you, if possible. Clearly mark and make sure that you can see: Any grab bars or handrails. First and last steps. Where the edge of each step is. Use tools that help you move around (mobility aids) if they are needed. These include: Canes. Walkers. Scooters. Crutches. Turn on the lights when you go into a dark area. Replace any light bulbs as soon as they burn out. Set up your furniture so you have a clear path. Avoid moving your furniture  around. If any of your floors are uneven, fix them. If there are any pets around you, be aware of where they are. Review your medicines with your doctor. Some medicines can make you feel dizzy. This can increase your chance of falling. Ask your doctor what other things that you can do to help prevent falls. This information is not intended to replace advice given to you by your health care provider. Make sure you discuss any questions you have with your health care provider. Document Released: 01/24/2009 Document Revised: 09/05/2015 Document Reviewed: 05/04/2014 Elsevier Interactive Patient Education  2017 [employer].

## 2021-03-19 NOTE — Progress Notes (Signed)
Subjective:   Bryan West is a 60 y.o. male who presents for Medicare Annual/Subsequent preventive examination.  I connected with  Bryan West primary care giver on 03/19/21 by a telephone enabled telemedicine application and verified that I am speaking with the correct person using two identifiers.     Bryan West the patients caregiver answered all questions for patient.   I discussed the limitations of evaluation and management by telemedicine. The patient expressed understanding and agreed to proceed.  Patient location: home  Provider location:  Tele-Health not in office    Review of Systems   Cardiac Risk Factors include: advanced age (>23men, >77 women);hypertension;male gender (mentally challenged)     Objective:    Today's Vitals   There is no height or weight on file to calculate BMI.  Advanced Directives 03/19/2021 03/18/2020  Does Patient Have a Medical Advance Directive? No No  Would patient like information on creating a medical advance directive? No - Patient declined -    Current Medications (verified) Outpatient Encounter Medications as of 03/19/2021  Medication Sig   calcipotriene-betamethasone (TACLONEX) ointment Apply to aa's QD PRN flares.   fluorouracil (EFUDEX) 5 % cream Apply topically 2 (two) times daily. In early November - Apply to temples, cheeks, and tops of ears twice a day x 1 week   ketoconazole (NIZORAL) 2 % shampoo    ketoconazole (NIZORAL) 2 % shampoo Apply 1 application topically 3 (three) times a week. Wash scalp 3 times weekly, let sit 5 minutes before rinsing out   losartan (COZAAR) 50 MG tablet Take 1 tablet (50 mg total) by mouth daily.   mometasone (ELOCON) 0.1 % cream Apply 1 application topically as directed. Qd up to 5 days a week aa psoriasis on buttocks or body prn flares   mometasone (ELOCON) 0.1 % lotion Apply once a day up to 5d/wk aa scalp prn flares   naproxen (NAPROSYN) 500 MG tablet Take 1 tablet (500 mg total) by  mouth 2 (two) times daily with a meal.   hydrochlorothiazide (MICROZIDE) 12.5 MG capsule Take 1 capsule (12.5 mg total) by mouth daily. (Patient not taking: Reported on 03/19/2021)   No facility-administered encounter medications on file as of 03/19/2021.    Allergies (verified) Patient has no known allergies.   History: Past Medical History:  Diagnosis Date   Hyperbilirubinemia    Hyperlipidemia    Hypertension    Lymphocytosis    MR (mental retardation)    Prostatitis    Prostatitis    Past Surgical History:  Procedure Laterality Date   CHOLECYSTECTOMY     Family History  Problem Relation Age of Onset   Pulmonary fibrosis Mother    Hypertension Father    Cancer Father        prostate   Cancer Brother        skin   Hypertension Brother    Social History   Socioeconomic History   Marital status: Single    Spouse name: Not on file   Number of children: Not on file   Years of education: Not on file   Highest education level: Not on file  Occupational History   Not on file  Tobacco Use   Smoking status: Never   Smokeless tobacco: Never  Vaping Use   Vaping Use: Never used  Substance and Sexual Activity   Alcohol use: No   Drug use: No   Sexual activity: Not on file  Other Topics Concern  Not on file  Social History Narrative   Not on file   Social Determinants of Health   Financial Resource Strain: Low Risk    Difficulty of Paying Living Expenses: Not hard at all  Food Insecurity: No Food Insecurity   Worried About Programme researcher, broadcasting/film/video in the Last Year: Never true   Barista in the Last Year: Never true  Transportation Needs: No Transportation Needs   Lack of Transportation (Medical): No   Lack of Transportation (Non-Medical): No  Physical Activity: Inactive   Days of Exercise per Week: 0 days   Minutes of Exercise per Session: 0 min  Stress: No Stress Concern Present   Feeling of Stress : Not at all  Social Connections: Moderately Isolated    Frequency of Communication with Friends and Family: More than three times a week   Frequency of Social Gatherings with Friends and Family: More than three times a week   Attends Religious Services: More than 4 times per year   Active Member of [employer] or Organizations: No   Attends Engineer, structural: Never   Marital Status: Never married    Tobacco Counseling Counseling given: Not Answered   Clinical Intake:  Pre-visit preparation completed: Yes  Pain : No/denies pain     Nutritional Risks: None Diabetes: No  How often do you need to have someone help you when you read instructions, pamphlets, or other written materials from your doctor or pharmacy?: 5 - Always  Diabetic?  no     Information entered by :: Bryan Haggard LPN   Activities of Daily Living In your present state of health, do you have any difficulty performing the following activities: 03/19/2021  Hearing? N  Vision? N  Difficulty concentrating or making decisions? Y  Walking or climbing stairs? N  Dressing or bathing? N  Doing errands, shopping? Y  Preparing Food and eating ? Y  Using the Toilet? N  In the past six months, have you accidently leaked urine? N  Do you have problems with loss of bowel control? N  Managing your Medications? Y  Managing your Finances? Y  Housekeeping or managing your Housekeeping? Y  Some recent data might be hidden    Patient Care Team: Dorcas Carrow, DO as PCP - General (Family Medicine)  Indicate any recent Medical Services you may have received from other than [facility] providers in the past year (date may be approximate).     Assessment:   This is a routine wellness examination for Bryan West.  Hearing/Vision screen Hearing Screening - Comments:: No trouble hearing Vision Screening - Comments:: Not up to date [facility]  Dietary issues and exercise activities discussed: Current Exercise Habits: The patient does not participate in regular exercise at  present, Exercise limited by: None identified   Goals Addressed             This Visit's Progress    Patient Stated       No goals       Depression Screen PHQ 2/9 Scores 03/19/2021 03/18/2020 03/18/2020 02/28/2019 02/26/2017 01/31/2016  PHQ - 2 Score 0 0 0 0 0 0  PHQ- 9 Score - - - 0 - -  Exception Documentation - - - Medical reason - -    Fall Risk Fall Risk  03/19/2021 03/18/2020 02/28/2019 02/26/2017  Falls in the past year? 0 0 0 No  Number falls in past yr: 0 - 0 -  Injury with Fall? 0 -  0 -  Risk for fall due to : - Medication side effect - -  Follow up Falls evaluation completed;Falls prevention discussed Falls evaluation completed;Education provided - -    FALL RISK PREVENTION PERTAINING TO THE HOME:  Any stairs in or around the home? No  If so, are there any without handrails? No  Home free of loose throw rugs in walkways, pet beds, electrical cords, etc? Yes  Adequate lighting in your home to reduce risk of falls? Yes   ASSISTIVE DEVICES UTILIZED TO PREVENT FALLS:  Life alert? No  Use of a cane, walker or w/c? No  Grab bars in the bathroom? Yes  Shower chair or bench in shower? No  Elevated toilet seat or a handicapped toilet? Yes   TIMED UP AND GO:  Was the test performed? No .    Cognitive Function:  unable to perform,      6CIT Screen 02/26/2017  What Year? 4 points  What month? 3 points    Immunizations Immunization History  Administered Date(s) Administered   Influenza,inj,Quad PF,6+ Mos 01/31/2016, 02/26/2017, 12/23/2017, 02/28/2019, 03/18/2020, 01/09/2021   Td 05/23/2007, 06/17/2017    TDAP status: Up to date  Flu Vaccine status: Up to date    Covid-19 vaccine status: Declined, Education has been provided regarding the importance of this vaccine but patient still declined. Advised may receive this vaccine at local pharmacy or Health Dept.or vaccine clinic. Aware to provide a copy of the vaccination record if obtained from local  pharmacy or Health Dept. Verbalized acceptance and understanding.  Qualifies for Shingles Vaccine? Yes   Zostavax completed No   Shingrix Completed?: No.    Education has been provided regarding the importance of this vaccine. Patient has been advised to call insurance company to determine out of pocket expense if they have not yet received this vaccine. Advised may also receive vaccine at local pharmacy or Health Dept. Verbalized acceptance and understanding.  Screening Tests Health Maintenance  Topic Date Due   COLONOSCOPY (Pts 45-2yrs Insurance coverage will need to be confirmed)  10/03/2019   COVID-19 Vaccine (1) 04/04/2021 (Originally 01/19/1961)   Zoster Vaccines- Shingrix (1 of 2) 06/17/2021 (Originally 07/20/1978)   Pneumococcal Vaccine 28-37 Years old (1 - PCV) 03/19/2022 (Originally 07/19/1966)   TETANUS/TDAP  06/18/2027   INFLUENZA VACCINE  Completed   Hepatitis C Screening  Completed   HIV Screening  Completed   HPV VACCINES  Aged Out    Health Maintenance  Health Maintenance Due  Topic Date Due   COLONOSCOPY (Pts 45-53yrs Insurance coverage will need to be confirmed)  10/03/2019    Colorectal cancer screening: Type of screening: Cologuard. Completed 2022. Repeat every 3 years  Lung Cancer Screening: (Low Dose CT Chest recommended if Age 46-80 years, 30 pack-year currently smoking OR have quit w/in 15years.) does not qualify.   Lung Cancer Screening Referral:   Additional Screening:  Hepatitis C Screening: does not qualify; Completed 2011  Vision Screening: Recommended annual ophthalmology exams for early detection of glaucoma and other disorders of the eye. Is the patient up to date with their annual eye exam?  No  Who is the provider or what is the name of the office in which the patient attends annual eye exams? [facility] If pt is not established with a provider, would they like to be referred to a provider to establish care? No .   Dental Screening:  Recommended annual dental exams for proper oral hygiene  Community Resource Referral / Chronic  Care Management: CRR required this visit?  No   CCM required this visit?  No      Plan:     I have personally reviewed and noted the following in the patient's chart:   Medical and social history Use of alcohol, tobacco or illicit drugs  Current medications and supplements including opioid prescriptions. Patient is not currently taking opioid prescriptions. Functional ability and status Nutritional status Physical activity Advanced directives List of other physicians Hospitalizations, surgeries, and ER visits in previous 12 months Vitals Screenings to include cognitive, depression, and falls Referrals and appointments  In addition, I have reviewed and discussed with patient certain preventive protocols, quality metrics, and best practice recommendations. A written personalized care plan for preventive services as well as general preventive health recommendations were provided to patient.     Bryan Haggard, LPN   [id number]   Nurse Notes:   Patient's care giver Kirke Breach states that patient has not been taken Hydrochlorothiazide.  He is only taking the Losartan.  He states he will address on his visit in March.

## 2021-04-16 ENCOUNTER — Ambulatory Visit (INDEPENDENT_AMBULATORY_CARE_PROVIDER_SITE_OTHER): Payer: Medicare Other | Admitting: Dermatology

## 2021-04-16 ENCOUNTER — Other Ambulatory Visit: Payer: Self-pay

## 2021-04-16 DIAGNOSIS — L820 Inflamed seborrheic keratosis: Secondary | ICD-10-CM | POA: Diagnosis not present

## 2021-04-16 DIAGNOSIS — L409 Psoriasis, unspecified: Secondary | ICD-10-CM

## 2021-04-16 DIAGNOSIS — L578 Other skin changes due to chronic exposure to nonionizing radiation: Secondary | ICD-10-CM | POA: Diagnosis not present

## 2021-04-16 DIAGNOSIS — L570 Actinic keratosis: Secondary | ICD-10-CM | POA: Diagnosis not present

## 2021-04-16 NOTE — Patient Instructions (Signed)

## 2021-04-16 NOTE — Progress Notes (Signed)
Follow-Up Visit   Subjective  Bryan West is a 61 y.o. male who presents for the following: Psoriasis (Follow up - lower back - mometasone cream is not helping). The patient has spots, moles and lesions to be evaluated, some may be new or changing and the patient has concerns that these could be cancer. Patient has history of precancerous actinic keratoses treated in the past. Accompanied by brother who contributes to history  The following portions of the chart were reviewed this encounter and updated as appropriate:   Tobacco   Allergies   Meds   Problems   Med Hx   Surg Hx   Fam Hx      Review of Systems:  No other skin or systemic complaints except as noted in HPI or Assessment and Plan.  Objective  Well appearing patient in no apparent distress; mood and affect are within normal limits.  A focused examination was performed including face, ears, hands, buttocks. Relevant physical exam findings are noted in the Assessment and Plan.  Sacral area 7.0 cm plaque     Left Ear x 2, right ear x 2, face/neck x 3, hands x 14 (21) Erythematous thin papules/macules with gritty scale.   Right Temple (2) Erythematous stuck-on, waxy papule or plaque   Assessment & Plan   Psoriasis -resistant to topical steroid mometasone Sacral area Psoriasis is a chronic non-curable, but treatable genetic/hereditary disease that may have other systemic features affecting other organ systems such as joints (Psoriatic Arthritis). It is associated with an increased risk of inflammatory bowel disease, heart disease, non-alcoholic fatty liver disease, and depression.    Start Zoryve cream qd - trade size given today  Related Medications ketoconazole (NIZORAL) 2 % shampoo Apply 1 application topically 3 (three) times a week. Wash scalp 3 times weekly, let sit 5 minutes before rinsing out  mometasone (ELOCON) 0.1 % cream Apply 1 application topically as directed. Qd up to 5 days a week aa psoriasis on  buttocks or body prn flares  mometasone (ELOCON) 0.1 % lotion Apply once a day up to 5d/wk aa scalp prn flares  AK (actinic keratosis) (21) Left Ear x 2, right ear x 2, face/neck x 3, hands x 14 Destruction of lesion - Left Ear x 2, right ear x 2, face/neck x 3, hands x 14 Complexity: simple   Destruction method: cryotherapy   Informed consent: discussed and consent obtained   Timeout:  patient name, date of birth, surgical site, and procedure verified Lesion destroyed using liquid nitrogen: Yes   Region frozen until ice ball extended beyond lesion: Yes   Outcome: patient tolerated procedure well with no complications   Post-procedure details: wound care instructions given    Related Medications fluorouracil (EFUDEX) 5 % cream Apply topically 2 (two) times daily. In early November - Apply to temples, cheeks, and tops of ears twice a day x 1 week  Inflamed seborrheic keratosis (2) Right Temple Destruction of lesion - Right Temple Complexity: simple   Destruction method: cryotherapy   Informed consent: discussed and consent obtained   Timeout:  patient name, date of birth, surgical site, and procedure verified Lesion destroyed using liquid nitrogen: Yes   Region frozen until ice ball extended beyond lesion: Yes   Outcome: patient tolerated procedure well with no complications   Post-procedure details: wound care instructions given    Actinic Damage - chronic, secondary to cumulative UV radiation exposure/sun exposure over time - diffuse scaly erythematous macules with underlying dyspigmentation -  Recommend daily broad spectrum sunscreen SPF 30+ to sun-exposed areas, reapply every 2 hours as needed.  - Recommend staying in the shade or wearing long sleeves, sun glasses (UVA+UVB protection) and wide brim hats (4-inch brim around the entire circumference of the hat). - Call for new or changing lesions.  Return in about 3 months (around 07/15/2021) for Follow up.  I, Joanie Coddington,  CMA, am acting as scribe for Armida Sans, MD . Documentation: I have reviewed the above documentation for accuracy and completeness, and I agree with the above.  Armida Sans, MD

## 2021-04-17 ENCOUNTER — Encounter: Payer: Self-pay | Admitting: Dermatology

## 2021-04-18 ENCOUNTER — Other Ambulatory Visit: Payer: Self-pay

## 2021-04-18 ENCOUNTER — Encounter: Payer: Self-pay | Admitting: Family Medicine

## 2021-04-18 ENCOUNTER — Ambulatory Visit (INDEPENDENT_AMBULATORY_CARE_PROVIDER_SITE_OTHER): Payer: Medicare Other | Admitting: Family Medicine

## 2021-04-18 VITALS — BP 143/83 | HR 73 | Temp 97.5°F | Wt 182.6 lb

## 2021-04-18 DIAGNOSIS — M25512 Pain in left shoulder: Secondary | ICD-10-CM | POA: Diagnosis not present

## 2021-04-18 DIAGNOSIS — G8929 Other chronic pain: Secondary | ICD-10-CM | POA: Diagnosis not present

## 2021-04-18 MED ORDER — DICLOFENAC SODIUM 1 % EX GEL: 4.0000 g | Freq: Four times a day (QID) | CUTANEOUS | 2 refills | Status: DC

## 2021-04-18 NOTE — Progress Notes (Signed)
BP (!) 143/83    Pulse 73    Temp (!) 97.5 F (36.4 C)    Wt 182 lb 9.6 oz (82.8 kg)    SpO2 97%    BMI 28.60 kg/m    Subjective:    Patient ID: Bryan West, male    DOB: November 11, 1960, 61 y.o.   MRN: [id number]  HPI: TREMANE SPURGEON is a 61 y.o. male  Chief Complaint  Patient presents with   Arm Pain    Patient left arm from elbow to shoulder has been hurting for about a month    ARM PAIN Duration: about a month Location: L arm just below his shoulder Mechanism of injury: unknown Onset: gradual Severity: moderate  Quality:  aching and sore Frequency: constant Radiation: no Aggravating factors: lifting  Alleviating factors: nothing  Status: stable Treatments attempted: none  Relief with NSAIDs?:  No NSAIDs Taken Swelling: no Redness: no  Warmth: no Trauma: no Chest pain: no  Shortness of breath: no  Fever: no Decreased sensation: no Paresthesias: no Weakness: no  Relevant past medical, surgical, family and social history reviewed and updated as indicated. Interim medical history since our last visit reviewed. Allergies and medications reviewed and updated.  Review of Systems  Constitutional: Negative.   Respiratory: Negative.    Cardiovascular: Negative.   Gastrointestinal: Negative.   Musculoskeletal:  Positive for arthralgias. Negative for back pain, gait problem, joint swelling, myalgias, neck pain and neck stiffness.  Skin: Negative.   Neurological: Negative.   Psychiatric/Behavioral: Negative.     Per HPI unless specifically indicated above     Objective:    BP (!) 143/83    Pulse 73    Temp (!) 97.5 F (36.4 C)    Wt 182 lb 9.6 oz (82.8 kg)    SpO2 97%    BMI 28.60 kg/m   Wt Readings from Last 3 Encounters:  04/18/21 182 lb 9.6 oz (82.8 kg)  01/09/21 180 lb (81.6 kg)  03/18/20 180 lb 3.2 oz (81.7 kg)    Physical Exam Vitals and nursing note reviewed.  Constitutional:      General: He is not in acute distress.    Appearance: Normal appearance. He  is not ill-appearing, toxic-appearing or diaphoretic.  HENT:     Head: Normocephalic and atraumatic.     Right Ear: External ear normal.     Left Ear: External ear normal.     Nose: Nose normal.     Mouth/Throat:     Mouth: Mucous membranes are moist.     Pharynx: Oropharynx is clear.  Eyes:     General: No scleral icterus.       Right eye: No discharge.        Left eye: No discharge.     Extraocular Movements: Extraocular movements intact.     Conjunctiva/sclera: Conjunctivae normal.     Pupils: Pupils are equal, round, and reactive to light.  Cardiovascular:     Rate and Rhythm: Normal rate and regular rhythm.     Pulses: Normal pulses.     Heart sounds: Normal heart sounds. No murmur heard.   No friction rub. No gallop.  Pulmonary:     Effort: Pulmonary effort is normal. No respiratory distress.     Breath sounds: Normal breath sounds. No stridor. No wheezing, rhonchi or rales.  Chest:     Chest wall: No tenderness.  Musculoskeletal:        General: Tenderness present. No swelling, deformity or  signs of injury.     Cervical back: Normal range of motion and neck supple.     Right lower leg: No edema.     Left lower leg: No edema.     Comments: Decreased ROM to abduction. Pain with palpation over deltoid and biceps tendon  Skin:    General: Skin is warm and dry.     Capillary Refill: Capillary refill takes less than 2 seconds.     Coloration: Skin is not jaundiced or pale.     Findings: No bruising, erythema, lesion or rash.  Neurological:     General: No focal deficit present.     Mental Status: He is alert and oriented to person, place, and time. Mental status is at baseline.  Psychiatric:        Mood and Affect: Mood normal.        Behavior: Behavior normal.        Thought Content: Thought content normal.        Judgment: Judgment normal.    Results for orders placed or performed in visit on 01/09/21  Comprehensive metabolic panel  Result Value Ref Range    Glucose 102 (H) 70 - 99 mg/dL   BUN 10 8 - 27 mg/dL   Creatinine, Ser 0.92 0.76 - 1.27 mg/dL   eGFR 95 >59 mL/min/1.73   BUN/Creatinine Ratio 11 10 - 24   Sodium 140 134 - 144 mmol/L   Potassium 4.0 3.5 - 5.2 mmol/L   Chloride 102 96 - 106 mmol/L   CO2 26 20 - 29 mmol/L   Calcium 9.3 8.6 - 10.2 mg/dL   Total Protein 7.1 6.0 - 8.5 g/dL   Albumin 4.2 3.8 - 4.8 g/dL   Globulin, Total 2.9 1.5 - 4.5 g/dL   Albumin/Globulin Ratio 1.4 1.2 - 2.2   Bilirubin Total 0.9 0.0 - 1.2 mg/dL   Alkaline Phosphatase 92 44 - 121 IU/L   AST 11 0 - 40 IU/L   ALT 29 0 - 44 IU/L  Lipid Panel w/o Chol/HDL Ratio  Result Value Ref Range   Cholesterol, Total 204 (H) 100 - 199 mg/dL   Triglycerides 190 (H) 0 - 149 mg/dL   HDL 45 >39 mg/dL   VLDL Cholesterol Cal 34 5 - 40 mg/dL   LDL Chol Calc (NIH) 125 (H) 0 - 99 mg/dL  Cologuard  Result Value Ref Range   COLOGUARD Negative Negative      Assessment & Plan:   Problem List Items Addressed This Visit   None    Follow up plan: No follow-ups on file.

## 2021-04-21 ENCOUNTER — Ambulatory Visit
Admission: RE | Admit: 2021-04-21 | Discharge: 2021-04-21 | Disposition: A | Discharge status: Home / Self Care | Payer: Medicare Other | Source: Ambulatory Visit | Attending: Family Medicine | Admitting: Family Medicine

## 2021-04-21 ENCOUNTER — Other Ambulatory Visit: Payer: Self-pay

## 2021-04-21 ENCOUNTER — Ambulatory Visit
Admission: RE | Admit: 2021-04-21 | Discharge: 2021-04-21 | Disposition: A | Discharge status: Home / Self Care | Payer: Medicare Other | Attending: Family Medicine | Admitting: Family Medicine

## 2021-04-21 DIAGNOSIS — R221 Localized swelling, mass and lump, neck: Secondary | ICD-10-CM | POA: Diagnosis not present

## 2021-04-21 DIAGNOSIS — M79622 Pain in left upper arm: Secondary | ICD-10-CM | POA: Diagnosis not present

## 2021-04-21 DIAGNOSIS — M25512 Pain in left shoulder: Secondary | ICD-10-CM | POA: Insufficient documentation

## 2021-04-21 DIAGNOSIS — M47812 Spondylosis without myelopathy or radiculopathy, cervical region: Secondary | ICD-10-CM | POA: Diagnosis not present

## 2021-04-21 DIAGNOSIS — G8929 Other chronic pain: Secondary | ICD-10-CM

## 2021-04-21 DIAGNOSIS — R222 Localized swelling, mass and lump, trunk: Secondary | ICD-10-CM | POA: Diagnosis not present

## 2021-05-07 ENCOUNTER — Other Ambulatory Visit: Payer: Self-pay

## 2021-05-07 ENCOUNTER — Ambulatory Visit (INDEPENDENT_AMBULATORY_CARE_PROVIDER_SITE_OTHER): Payer: Medicare Other | Admitting: Dermatology

## 2021-05-07 DIAGNOSIS — L409 Psoriasis, unspecified: Secondary | ICD-10-CM

## 2021-05-07 DIAGNOSIS — L400 Psoriasis vulgaris: Secondary | ICD-10-CM

## 2021-05-07 NOTE — Progress Notes (Signed)
° °  Follow-Up Visit   Subjective  Bryan West is a 61 y.o. male who presents for the following: Psoriasis (3 weeks f/u on Psoriasis in the sacral area treating with Zoryve cream with a poor response ).  Brother with pt contributing to history   The following portions of the chart were reviewed this encounter and updated as appropriate:   Tobacco   Allergies   Meds   Problems   Med Hx   Surg Hx   Fam Hx      Review of Systems:  No other skin or systemic complaints except as noted in HPI or Assessment and Plan.  Objective  Well appearing patient in no apparent distress; mood and affect are within normal limits.  A focused examination was performed including sacral area. Relevant physical exam findings are noted in the Assessment and Plan.  sacral area 6 cm plaque         Assessment & Plan  Psoriasis -persistent and flared with thickening although smaller in size sacral area  Psoriasis plaque smaller today than previous visit  Psoriasis is a chronic non-curable, but treatable genetic/hereditary disease that may have other systemic features affecting other organ systems such as joints (Psoriatic Arthritis). It is associated with an increased risk of inflammatory bowel disease, heart disease, non-alcoholic fatty liver disease, and depression.     Cont Zoryve cream apply to skin twice a day   ILK 3 mg injected to sacral area tolerated well  Lot# [id number] Exp Aug 2024   Intralesional injection - sacral area Location: sacral area  Informed Consent: Discussed risks (infection, pain, bleeding, bruising, thinning of the skin, loss of skin pigment, lack of resolution, and recurrence of lesion) and benefits of the procedure, as well as the alternatives. Informed consent was obtained. Preparation: The area was prepared a standard fashion.  Procedure Details: An intralesional injection was performed with Kenalog 3.3 mg/cc. 3 cc in total were injected.  Total number of injections:  10  Plan: The patient was instructed on post-op care. Recommend OTC analgesia as needed for pain.   Related Medications ketoconazole (NIZORAL) 2 % shampoo Apply 1 application topically 3 (three) times a week. Wash scalp 3 times weekly, let sit 5 minutes before rinsing out  mometasone (ELOCON) 0.1 % cream Apply 1 application topically as directed. Qd up to 5 days a week aa psoriasis on buttocks or body prn flares  mometasone (ELOCON) 0.1 % lotion Apply once a day up to 5d/wk aa scalp prn flares   Return for as scheduled May 22, 2021.  IAngelique Holm, CMA, am acting as scribe for Armida Sans, MD .  Documentation: I have reviewed the above documentation for accuracy and completeness, and I agree with the above.  Armida Sans, MD

## 2021-05-07 NOTE — Patient Instructions (Signed)

## 2021-05-11 ENCOUNTER — Encounter: Payer: Self-pay | Admitting: Dermatology

## 2021-05-22 ENCOUNTER — Ambulatory Visit (INDEPENDENT_AMBULATORY_CARE_PROVIDER_SITE_OTHER): Payer: Medicare Other | Admitting: Dermatology

## 2021-05-22 ENCOUNTER — Other Ambulatory Visit: Payer: Self-pay

## 2021-05-22 DIAGNOSIS — L409 Psoriasis, unspecified: Secondary | ICD-10-CM | POA: Diagnosis not present

## 2021-05-22 NOTE — Patient Instructions (Signed)

## 2021-05-22 NOTE — Progress Notes (Signed)
° °  Follow-Up Visit   Subjective  Bryan West is a 61 y.o. male who presents for the following: Psoriasis (Sacral area, Zoryve cream qd, IL injection of kenalog last visit).  Patient accompanied by brother who contributes to history.  The following portions of the chart were reviewed this encounter and updated as appropriate:   Tobacco   Allergies   Meds   Problems   Med Hx   Surg Hx   Fam Hx      Review of Systems:  No other skin or systemic complaints except as noted in HPI or Assessment and Plan.  Objective  Well appearing patient in no apparent distress; mood and affect are within normal limits.  A focused examination was performed including extremities, including the arms, hands, fingers, and fingernails and the legs, feet, toes, and toenails and buttocks. Relevant physical exam findings are noted in the Assessment and Plan.  sacral area Psoriatic plaque    Assessment & Plan  Psoriasis sacral area Chronic and persistent condition with duration or expected duration over one year. Condition is symptomatic/ bothersome to patient. Not currently at goal.  Persistent and flared with thickening although smaller in size  Psoriasis is a chronic non-curable, but treatable genetic/hereditary disease that may have other systemic features affecting other organ systems such as joints (Psoriatic Arthritis). It is associated with an increased risk of inflammatory bowel disease, heart disease, non-alcoholic fatty liver disease, and depression.     Cont Zoryve cr qd IL injection of Kenalog 3.0 mg/cc Total volume = 2.0cc  May consider adding Tazorac in the future  Intralesional injection - sacral area Location: sacral area  Informed Consent: Discussed risks (infection, pain, bleeding, bruising, thinning of the skin, loss of skin pigment, lack of resolution, and recurrence of lesion) and benefits of the procedure, as well as the alternatives. Informed consent was obtained. Preparation: The  area was prepared a standard fashion.  Procedure Details: An intralesional injection was performed with Kenalog 3.0 mg/cc. 2 cc in total were injected.  Total number of injections: 12  Plan: The patient was instructed on post-op care. Recommend OTC analgesia as needed for pain.  NDC 6568-1275-17  Related Medications ketoconazole (NIZORAL) 2 % shampoo Apply 1 application topically 3 (three) times a week. Wash scalp 3 times weekly, let sit 5 minutes before rinsing out  mometasone (ELOCON) 0.1 % cream Apply 1 application topically as directed. Qd up to 5 days a week aa psoriasis on buttocks or body prn flares  mometasone (ELOCON) 0.1 % lotion Apply once a day up to 5d/wk aa scalp prn flares  Return for as scheduled.  I, Ardis Rowan, RMA, am acting as scribe for Armida Sans, MD . Documentation: I have reviewed the above documentation for accuracy and completeness, and I agree with the above.  Armida Sans, MD

## 2021-05-27 ENCOUNTER — Encounter: Payer: Self-pay | Admitting: Dermatology

## 2021-06-13 ENCOUNTER — Other Ambulatory Visit: Payer: Self-pay | Admitting: Dermatology

## 2021-06-13 DIAGNOSIS — L409 Psoriasis, unspecified: Secondary | ICD-10-CM

## 2021-07-10 ENCOUNTER — Ambulatory Visit (INDEPENDENT_AMBULATORY_CARE_PROVIDER_SITE_OTHER): Payer: Medicare Other | Admitting: Family Medicine

## 2021-07-10 ENCOUNTER — Encounter: Payer: Self-pay | Admitting: Family Medicine

## 2021-07-10 VITALS — BP 132/82 | HR 81 | Temp 98.0°F | Wt 177.0 lb

## 2021-07-10 DIAGNOSIS — I10 Essential (primary) hypertension: Secondary | ICD-10-CM

## 2021-07-10 DIAGNOSIS — D72820 Lymphocytosis (symptomatic): Secondary | ICD-10-CM

## 2021-07-10 DIAGNOSIS — M1990 Unspecified osteoarthritis, unspecified site: Secondary | ICD-10-CM

## 2021-07-10 DIAGNOSIS — R3911 Hesitancy of micturition: Secondary | ICD-10-CM | POA: Diagnosis not present

## 2021-07-10 DIAGNOSIS — E782 Mixed hyperlipidemia: Secondary | ICD-10-CM | POA: Diagnosis not present

## 2021-07-10 MED ORDER — LOSARTAN POTASSIUM 50 MG PO TABS: 50.0000 mg | ORAL_TABLET | Freq: Every day | ORAL | 1 refills | Status: DC

## 2021-07-10 MED ORDER — MELOXICAM 15 MG PO TABS: 15.0000 mg | ORAL_TABLET | Freq: Every day | ORAL | 3 refills | Status: DC

## 2021-07-10 NOTE — Assessment & Plan Note (Signed)
Rechecking labs today. Await results treat as needed.  °

## 2021-07-10 NOTE — Progress Notes (Signed)
? ?BP 132/82   Pulse 81   Temp 98 ?F (36.7 ?C)   Wt 177 lb (80.3 kg)   SpO2 96%   BMI 27.72 kg/m?   ? ?Subjective:  ? ? Patient ID: Bryan West, male    DOB: 1961/02/11, 61 y.o.   MRN: [id number] ? ?HPI: ?Bryan West is a 61 y.o. male ? ?Chief Complaint  ?Patient presents with  ? Hypertension  ? Hyperlipidemia  ? hesitancy  ? Leg Pain  ?  Patient states he is having leg pain.   ? ?HYPERTENSION / HYPERLIPIDEMIA ?Satisfied with current treatment? yes ?Duration of hypertension: chronic ?BP monitoring frequency: not checking ?BP medication side effects: no ?Past BP meds: losartan ?Duration of hyperlipidemia: chronic ?Cholesterol medication side effects: no ?Cholesterol supplements: none ?Past cholesterol medications: none ?Medication compliance: excellent compliance ?Aspirin: no ?Recent stressors: no ?Recurrent headaches: no ?Visual changes: no ?Palpitations: no ?Dyspnea: no ?Chest pain: no ?Lower extremity edema: no ?Dizzy/lightheaded: no ? ? ?Relevant past medical, surgical, family and social history reviewed and updated as indicated. Interim medical history since our last visit reviewed. ?Allergies and medications reviewed and updated. ? ?Review of Systems  ?Constitutional: Negative.   ?Respiratory: Negative.    ?Cardiovascular: Negative.   ?Gastrointestinal: Negative.   ?Musculoskeletal:  Positive for arthralgias. Negative for back pain, gait problem, joint swelling, myalgias, neck pain and neck stiffness.  ?Skin: Negative.   ?Neurological: Negative.   ?Psychiatric/Behavioral: Negative.    ? ?Per HPI unless specifically indicated above ? ?   ?Objective:  ?  ?BP 132/82   Pulse 81   Temp 98 ?F (36.7 ?C)   Wt 177 lb (80.3 kg)   SpO2 96%   BMI 27.72 kg/m?   ?Wt Readings from Last 3 Encounters:  ?07/10/21 177 lb (80.3 kg)  ?04/18/21 182 lb 9.6 oz (82.8 kg)  ?01/09/21 180 lb (81.6 kg)  ?  ?Physical Exam ?Vitals and nursing note reviewed.  ?Constitutional:   ?   General: He is not in acute distress. ?   Appearance:  Normal appearance. He is not ill-appearing, toxic-appearing or diaphoretic.  ?HENT:  ?   Head: Normocephalic and atraumatic.  ?   Right Ear: External ear normal.  ?   Left Ear: External ear normal.  ?   Nose: Nose normal.  ?   Mouth/Throat:  ?   Mouth: Mucous membranes are moist.  ?   Pharynx: Oropharynx is clear.  ?Eyes:  ?   General: No scleral icterus.    ?   Right eye: No discharge.     ?   Left eye: No discharge.  ?   Extraocular Movements: Extraocular movements intact.  ?   Conjunctiva/sclera: Conjunctivae normal.  ?   Pupils: Pupils are equal, round, and reactive to light.  ?Cardiovascular:  ?   Rate and Rhythm: Normal rate and regular rhythm.  ?   Pulses: Normal pulses.  ?   Heart sounds: Normal heart sounds. No murmur heard. ?  No friction rub. No gallop.  ?Pulmonary:  ?   Effort: Pulmonary effort is normal. No respiratory distress.  ?   Breath sounds: Normal breath sounds. No stridor. No wheezing, rhonchi or rales.  ?Chest:  ?   Chest wall: No tenderness.  ?Musculoskeletal:     ?   General: Normal range of motion.  ?   Cervical back: Normal range of motion and neck supple.  ?Skin: ?   General: Skin is warm and dry.  ?  Capillary Refill: Capillary refill takes less than 2 seconds.  ?   Coloration: Skin is not jaundiced or pale.  ?   Findings: No bruising, erythema, lesion or rash.  ?Neurological:  ?   General: No focal deficit present.  ?   Mental Status: He is alert and oriented to person, place, and time. Mental status is at baseline.  ?Psychiatric:     ?   Mood and Affect: Mood normal.     ?   Behavior: Behavior normal.     ?   Thought Content: Thought content normal.     ?   Judgment: Judgment normal.  ? ? ?Results for orders placed or performed in visit on 01/09/21  ?Comprehensive metabolic panel  ?Result Value Ref Range  ? Glucose 102 (H) 70 - 99 mg/dL  ? BUN 10 8 - 27 mg/dL  ? Creatinine, Ser 0.92 0.76 - 1.27 mg/dL  ? eGFR 95 >59 mL/min/1.73  ? BUN/Creatinine Ratio 11 10 - 24  ? Sodium 140 134 - 144  mmol/L  ? Potassium 4.0 3.5 - 5.2 mmol/L  ? Chloride 102 96 - 106 mmol/L  ? CO2 26 20 - 29 mmol/L  ? Calcium 9.3 8.6 - 10.2 mg/dL  ? Total Protein 7.1 6.0 - 8.5 g/dL  ? Albumin 4.2 3.8 - 4.8 g/dL  ? Globulin, Total 2.9 1.5 - 4.5 g/dL  ? Albumin/Globulin Ratio 1.4 1.2 - 2.2  ? Bilirubin Total 0.9 0.0 - 1.2 mg/dL  ? Alkaline Phosphatase 92 44 - 121 IU/L  ? AST 11 0 - 40 IU/L  ? ALT 29 0 - 44 IU/L  ?Lipid Panel w/o Chol/HDL Ratio  ?Result Value Ref Range  ? Cholesterol, Total 204 (H) 100 - 199 mg/dL  ? Triglycerides 190 (H) 0 - 149 mg/dL  ? HDL 45 >39 mg/dL  ? VLDL Cholesterol Cal 34 5 - 40 mg/dL  ? LDL Chol Calc (NIH) 125 (H) 0 - 99 mg/dL  ?Cologuard  ?Result Value Ref Range  ? COLOGUARD Negative Negative  ? ?   ?Assessment & Plan:  ? ?Problem List Items Addressed This Visit   ? ?  ? Cardiovascular and Mediastinum  ? Hypertension - Primary  ?  Under good control on current regimen. Continue current regimen. Continue to monitor. Call with any concerns. Refills given. Labs drawn today. ? ?  ?  ? Relevant Medications  ? losartan (COZAAR) 50 MG tablet  ? Other Relevant Orders  ? Comprehensive metabolic panel  ? Microalbumin, Urine Waived  ? TSH  ?  ? Other  ? Hyperlipidemia  ?  Rechecking labs today. Await results treat as needed.  ?  ?  ? Relevant Medications  ? losartan (COZAAR) 50 MG tablet  ? Other Relevant Orders  ? Comprehensive metabolic panel  ? Lipid Panel w/o Chol/HDL Ratio  ? Lymphocytosis  ? Relevant Orders  ? CBC with Differential/Platelet  ? ?Other Visit Diagnoses   ? ? Hesitancy      ? Labs drawn today. Await results.   ? Relevant Orders  ? PSA  ? Urinalysis, Routine w reflex microscopic  ? Arthritis      ? Will start meloxicam. Call with any concerns.   ? Relevant Medications  ? meloxicam (MOBIC) 15 MG tablet  ? ?  ?  ? ?Follow up plan: ?Return in about 6 months (around 01/10/2022). ? ? ? ? ? ?

## 2021-07-10 NOTE — Assessment & Plan Note (Signed)
Under good control on current regimen. Continue current regimen. Continue to monitor. Call with any concerns. Refills given. Labs drawn today.   

## 2021-07-11 ENCOUNTER — Encounter: Payer: Self-pay | Admitting: Family Medicine

## 2021-07-11 LAB — COMPREHENSIVE METABOLIC PANEL
ALT: 25 IU/L (ref 0–44)
AST: 9 IU/L (ref 0–40)
Albumin/Globulin Ratio: 1.3 (ref 1.2–2.2)
Albumin: 4 g/dL (ref 3.8–4.8)
Alkaline Phosphatase: 100 IU/L (ref 44–121)
BUN/Creatinine Ratio: 14 (ref 10–24)
BUN: 12 mg/dL (ref 8–27)
Bilirubin Total: 0.8 mg/dL (ref 0.0–1.2)
CO2: 26 mmol/L (ref 20–29)
Calcium: 9.5 mg/dL (ref 8.6–10.2)
Chloride: 100 mmol/L (ref 96–106)
Creatinine, Ser: 0.88 mg/dL (ref 0.76–1.27)
Globulin, Total: 3.2 g/dL (ref 1.5–4.5)
Glucose: 110 mg/dL — ABNORMAL HIGH (ref 70–99)
Potassium: 4.2 mmol/L (ref 3.5–5.2)
Sodium: 137 mmol/L (ref 134–144)
Total Protein: 7.2 g/dL (ref 6.0–8.5)
eGFR: 98 mL/min/{1.73_m2} (ref >59)

## 2021-07-11 LAB — CBC WITH DIFFERENTIAL/PLATELET
Basophils Absolute: 0.1 10*3/uL (ref 0.0–0.2)
Basos: 1 %
EOS (ABSOLUTE): 0.1 10*3/uL (ref 0.0–0.4)
Eos: 1 %
Hematocrit: 41.6 % (ref 37.5–51.0)
Hemoglobin: 14.4 g/dL (ref 13.0–17.7)
Immature Grans (Abs): 0 10*3/uL (ref 0.0–0.1)
Immature Granulocytes: 1 %
Lymphocytes Absolute: 2.5 10*3/uL (ref 0.7–3.1)
Lymphs: 34 %
MCH: 31 pg (ref 26.6–33.0)
MCHC: 34.6 g/dL (ref 31.5–35.7)
MCV: 90 fL (ref 79–97)
Monocytes Absolute: 0.5 10*3/uL (ref 0.1–0.9)
Monocytes: 7 %
Neutrophils Absolute: 4.2 10*3/uL (ref 1.4–7.0)
Neutrophils: 56 %
Platelets: 221 10*3/uL (ref 150–450)
RBC: 4.64 x10E6/uL (ref 4.14–5.80)
RDW: 12.4 % (ref 11.6–15.4)
WBC: 7.4 10*3/uL (ref 3.4–10.8)

## 2021-07-11 LAB — LIPID PANEL W/O CHOL/HDL RATIO
Cholesterol, Total: 201 mg/dL — ABNORMAL HIGH (ref 100–199)
HDL: 49 mg/dL (ref >39)
LDL Chol Calc (NIH): 124 mg/dL — ABNORMAL HIGH (ref 0–99)
Triglycerides: 156 mg/dL — ABNORMAL HIGH (ref 0–149)
VLDL Cholesterol Cal: 28 mg/dL (ref 5–40)

## 2021-07-11 LAB — URINALYSIS, ROUTINE W REFLEX MICROSCOPIC
Bilirubin, UA: NEGATIVE
Glucose, UA: NEGATIVE
Ketones, UA: NEGATIVE
Leukocytes,UA: NEGATIVE
Nitrite, UA: NEGATIVE
Protein,UA: NEGATIVE
RBC, UA: NEGATIVE
Specific Gravity, UA: 1.01 (ref 1.005–1.030)
Urobilinogen, Ur: 0.2 mg/dL (ref 0.2–1.0)
pH, UA: 5.5 (ref 5.0–7.5)

## 2021-07-11 LAB — PSA: Prostate Specific Ag, Serum: 2.1 ng/mL (ref 0.0–4.0)

## 2021-07-11 LAB — TSH: TSH: 1.76 u[IU]/mL (ref 0.450–4.500)

## 2021-07-11 LAB — MICROALBUMIN, URINE WAIVED
Creatinine, Urine Waived: 50 mg/dL (ref 10–300)
Microalb, Ur Waived: 10 mg/L (ref 0–19)
Microalb/Creat Ratio: <30 mg/g (ref <30)

## 2021-07-15 ENCOUNTER — Encounter: Payer: Self-pay | Admitting: Dermatology

## 2021-07-15 ENCOUNTER — Ambulatory Visit (INDEPENDENT_AMBULATORY_CARE_PROVIDER_SITE_OTHER): Payer: Medicare Other | Admitting: Dermatology

## 2021-07-15 DIAGNOSIS — L409 Psoriasis, unspecified: Secondary | ICD-10-CM

## 2021-07-15 MED ORDER — MOMETASONE FUROATE 0.1 % EX SOLN: CUTANEOUS | 0 refills | Status: DC

## 2021-07-15 NOTE — Patient Instructions (Signed)

## 2021-07-15 NOTE — Progress Notes (Signed)
? ?  Follow-Up Visit ?  ?Subjective  ?Bryan West is a 61 y.o. male who presents for the following: Psoriasis (Of the sacral area and scalp - currently using Mometasone solution to aa's QD PRN, Zoryve cream on the sacral area and getting ILK injections to the plaque on the sacral area here in the office. Condition has improved since starting ILK injections. ). ? ?The following portions of the chart were reviewed this encounter and updated as appropriate:  ? Tobacco  Allergies  Meds  Problems  Med Hx  Surg Hx  Fam Hx   ?  ?Review of Systems:  No other skin or systemic complaints except as noted in HPI or Assessment and Plan. ? ?Objective  ?Well appearing patient in no apparent distress; mood and affect are within normal limits. ? ?A focused examination was performed including the scalp and sacral area. Relevant physical exam findings are noted in the Assessment and Plan. ? ? ?Assessment & Plan  ?Psoriasis ?Sacral area and scalp ?Chronic and persistent condition with duration or expected duration over one year. Condition is symptomatic / bothersome to patient. Not to goal.  But improving on current treatment.  Patient would like intralesional steroid injection on the sacral area today. ?Psoriasis is a chronic non-curable, but treatable genetic/hereditary disease that may have other systemic features affecting other organ systems such as joints (Psoriatic Arthritis). It is associated with an increased risk of inflammatory bowel disease, heart disease, non-alcoholic fatty liver disease, and depression.   ? ?Continue Zoryve cream to aa's QD PRN sacral area.  ? ?Continue Ketoconazole 2% shampoo 3d/wk and  ?Mometasone solution to aa's scalp QD up to 5d/wk.  ? ?Intralesional injection - Sacral area and scalp ?Location: Sacral area ? ?Informed Consent: Discussed risks (infection, pain, bleeding, bruising, thinning of the skin, loss of skin pigment, lack of resolution, and recurrence of lesion) and benefits of the  procedure, as well as the alternatives. Informed consent was obtained. ?Preparation: The area was prepared a standard fashion. ? ?Procedure Details: An intralesional injection was performed with Kenalog 3.0mg /mL. 1.5 cc in total were injected. ? ?Total number of injections: 10 ? ?Plan: The patient was instructed on post-op care. Recommend OTC analgesia as needed for pain. ? ?Lot number [id number] Expiration date 11/2022 [phone] ? ?Related Medications ?ketoconazole (NIZORAL) 2 % shampoo ?Apply 1 application topically 3 (three) times a week. Wash scalp 3 times weekly, let sit 5 minutes before rinsing out ? ?mometasone (ELOCON) 0.1 % cream ?Apply 1 application topically as directed. Qd up to 5 days a week aa psoriasis on buttocks or body prn flares ? ?mometasone (ELOCON) 0.1 % lotion ?Apply once a day up to 5 days a week to scalp as needed for flares ? ?Return for psoriasis in 3-4 mths. ? ?I, Rudell Cobb, CMA, am acting as scribe for Sarina Ser, MD . ?Documentation: I have reviewed the above documentation for accuracy and completeness, and I agree with the above. ? ?Sarina Ser, MD ? ?

## 2021-09-22 DIAGNOSIS — M79675 Pain in left toe(s): Secondary | ICD-10-CM | POA: Diagnosis not present

## 2021-09-22 DIAGNOSIS — B351 Tinea unguium: Secondary | ICD-10-CM | POA: Diagnosis not present

## 2021-09-22 DIAGNOSIS — L600 Ingrowing nail: Secondary | ICD-10-CM | POA: Diagnosis not present

## 2021-09-22 DIAGNOSIS — M79674 Pain in right toe(s): Secondary | ICD-10-CM | POA: Diagnosis not present

## 2021-09-24 ENCOUNTER — Ambulatory Visit (INDEPENDENT_AMBULATORY_CARE_PROVIDER_SITE_OTHER): Payer: Medicare Other | Admitting: Dermatology

## 2021-09-24 DIAGNOSIS — L409 Psoriasis, unspecified: Secondary | ICD-10-CM | POA: Diagnosis not present

## 2021-09-24 DIAGNOSIS — L578 Other skin changes due to chronic exposure to nonionizing radiation: Secondary | ICD-10-CM | POA: Diagnosis not present

## 2021-09-24 DIAGNOSIS — L570 Actinic keratosis: Secondary | ICD-10-CM | POA: Diagnosis not present

## 2021-09-24 NOTE — Progress Notes (Signed)
   Follow-Up Visit   Subjective  Bryan West is a 61 y.o. male who presents for the following: Psoriasis (Follow up of sacral area and scalp - treated with IL injections, Ketoconazole 2% shampoo, Mometasone solution and Zoryve cream).  The following portions of the chart were reviewed this encounter and updated as appropriate:   Tobacco  Allergies  Meds  Problems  Med Hx  Surg Hx  Fam Hx     Review of Systems:  No other skin or systemic complaints except as noted in HPI or Assessment and Plan.  Objective  Well appearing patient in no apparent distress; mood and affect are within normal limits.  A focused examination was performed including scalp, face, sacral area. Relevant physical exam findings are noted in the Assessment and Plan.  Sacral area Flat pink plaque 5.0 cm     Face, ears (6) Erythematous thin papules/macules with gritty scale.    Assessment & Plan   Actinic Damage - chronic, secondary to cumulative UV radiation exposure/sun exposure over time - diffuse scaly erythematous macules with underlying dyspigmentation - Recommend daily broad spectrum sunscreen SPF 30+ to sun-exposed areas, reapply every 2 hours as needed.  - Recommend staying in the shade or wearing long sleeves, sun glasses (UVA+UVB protection) and wide brim hats (4-inch brim around the entire circumference of the hat). - Call for new or changing lesions.  Psoriasis Sacral area Chronic and persistent condition with duration or expected duration over one year. Condition is symptomatic / bothersome to patient. Not to goal, but much improved and less itchy than before and much thinner. See photo Psoriasis is a chronic non-curable, but treatable genetic/hereditary disease that may have other systemic features affecting other organ systems such as joints (Psoriatic Arthritis). It is associated with an increased risk of inflammatory bowel disease, heart disease, non-alcoholic fatty liver disease, and  depression.    Continue Zoryve cream qd - trade size sample given today Patient and brother declined intralesional steroid injection today.  Related Medications ketoconazole (NIZORAL) 2 % shampoo Apply 1 application topically 3 (three) times a week. Wash scalp 3 times weekly, let sit 5 minutes before rinsing out  mometasone (ELOCON) 0.1 % lotion Apply once a day up to 5 days a week to scalp as needed for flares  AK (actinic keratosis) (6) Face, ears Destruction of lesion - Face, ears Complexity: simple   Destruction method: cryotherapy   Informed consent: discussed and consent obtained   Timeout:  patient name, date of birth, surgical site, and procedure verified Lesion destroyed using liquid nitrogen: Yes   Region frozen until ice ball extended beyond lesion: Yes   Outcome: patient tolerated procedure well with no complications   Post-procedure details: wound care instructions given    Return for 3-4 months.  I, Ashok Cordia, CMA, am acting as scribe for Sarina Ser, MD . Documentation: I have reviewed the above documentation for accuracy and completeness, and I agree with the above.  Sarina Ser, MD

## 2021-09-24 NOTE — Patient Instructions (Signed)
Cryotherapy Aftercare  Wash gently with soap and water everyday.   Apply Vaseline and Band-Aid daily until healed.     Due to recent changes in healthcare laws, you may see results of your pathology and/or laboratory studies on MyChart before the doctors have had a chance to review them. We understand that in some cases there may be results that are confusing or concerning to you. Please understand that not all results are received at the same time and often the doctors may need to interpret multiple results in order to provide you with the best plan of care or course of treatment. Therefore, we ask that you please give us 2 business days to thoroughly review all your results before contacting the office for clarification. Should we see a critical lab result, you will be contacted sooner.   If You Need Anything After Your Visit  If you have any questions or concerns for your doctor, please call our main line at [phone] and press option 4 to reach your doctor's medical assistant. If no one answers, please leave a voicemail as directed and we will return your call as soon as possible. Messages left after 4 pm will be answered the following business day.   You may also send us a message via MyChart. We typically respond to MyChart messages within 1-2 business days.  For prescription refills, please ask your pharmacy to contact our office. Our fax number is [phone].  If you have an urgent issue when the clinic is closed that cannot wait until the next business day, you can page your doctor at the number below.    Please note that while we do our best to be available for urgent issues outside of office hours, we are not available 24/7.   If you have an urgent issue and are unable to reach us, you may choose to seek medical care at your doctor's office, retail clinic, urgent care center, or emergency room.  If you have a medical emergency, please immediately call 911 or go to the  emergency department.  Pager Numbers  - Dr. Kowalski: [phone]  - Dr. Moye: [phone]  - Dr. Stewart: [phone]  In the event of inclement weather, please call our main line at [phone] for an update on the status of any delays or closures.  Dermatology Medication Tips: Please keep the boxes that topical medications come in in order to help keep track of the instructions about where and how to use these. Pharmacies typically print the medication instructions only on the boxes and not directly on the medication tubes.   If your medication is too expensive, please contact our office at [phone] option 4 or send us a message through MyChart.   We are unable to tell what your co-pay for medications will be in advance as this is different depending on your insurance coverage. However, we may be able to find a substitute medication at lower cost or fill out paperwork to get insurance to cover a needed medication.   If a prior authorization is required to get your medication covered by your insurance company, please allow us 1-2 business days to complete this process.  Drug prices often vary depending on where the prescription is filled and some pharmacies may offer cheaper prices.  The website www.goodrx.com contains coupons for medications through different pharmacies. The prices here do not account for what the cost may be with help from insurance (it may be cheaper with your insurance), but the website can   give you the price if you did not use any insurance.  - You can print the associated coupon and take it with your prescription to the pharmacy.  - You may also stop by our office during regular business hours and pick up a GoodRx coupon card.  - If you need your prescription sent electronically to a different pharmacy, notify our office through [facility] MyChart or by phone at [phone] option 4.     Si Usted Necesita Algo Despus de Su Visita  Tambin puede  enviarnos un mensaje a travs de MyChart. Por lo general respondemos a los mensajes de MyChart en el transcurso de 1 a 2 das hbiles.  Para renovar recetas, por favor pida a su farmacia que se ponga en contacto con nuestra oficina. Nuestro nmero de fax es el [phone].  Si tiene un asunto urgente cuando la clnica est cerrada y que no puede esperar hasta el siguiente da hbil, puede llamar/localizar a su doctor(a) al nmero que aparece a continuacin.   Por favor, tenga en cuenta que aunque hacemos todo lo posible para estar disponibles para asuntos urgentes fuera del horario de oficina, no estamos disponibles las 24 horas del da, los 7 das de la semana.   Si tiene un problema urgente y no puede comunicarse con nosotros, puede optar por buscar atencin mdica  en el consultorio de su doctor(a), en una clnica privada, en un centro de atencin urgente o en una sala de emergencias.  Si tiene una emergencia mdica, por favor llame inmediatamente al 911 o vaya a la sala de emergencias.  Nmeros de bper  - Dr. Kowalski: [phone]  - Dra. Moye: [phone]  - Dra. Stewart: [phone]  En caso de inclemencias del tiempo, por favor llame a nuestra lnea principal al [phone] para una actualizacin sobre el estado de cualquier retraso o cierre.  Consejos para la medicacin en dermatologa: Por favor, guarde las cajas en las que vienen los medicamentos de uso tpico para ayudarle a seguir las instrucciones sobre dnde y cmo usarlos. Las farmacias generalmente imprimen las instrucciones del medicamento slo en las cajas y no directamente en los tubos del medicamento.   Si su medicamento es muy caro, por favor, pngase en contacto con nuestra oficina llamando al [phone] y presione la opcin 4 o envenos un mensaje a travs de MyChart.   No podemos decirle cul ser su copago por los medicamentos por adelantado ya que esto es diferente dependiendo de la cobertura de su seguro.  Sin embargo, es posible que podamos encontrar un medicamento sustituto a menor costo o llenar un formulario para que el seguro cubra el medicamento que se considera necesario.   Si se requiere una autorizacin previa para que su compaa de seguros cubra su medicamento, por favor permtanos de 1 a 2 das hbiles para completar este proceso.  Los precios de los medicamentos varan con frecuencia dependiendo del lugar de dnde se surte la receta y alguna farmacias pueden ofrecer precios ms baratos.  El sitio web www.goodrx.com tiene cupones para medicamentos de diferentes farmacias. Los precios aqu no tienen en cuenta lo que podra costar con la ayuda del seguro (puede ser ms barato con su seguro), pero el sitio web puede darle el precio si no utiliz ningn seguro.  - Puede imprimir el cupn correspondiente y llevarlo con su receta a la farmacia.  - Tambin puede pasar por nuestra oficina durante el horario de atencin regular y recoger una tarjeta de cupones de GoodRx.  -   Si necesita que su receta se enve electrnicamente a una farmacia diferente, informe a nuestra oficina a travs de MyChart de [place] o por telfono llamando al [phone] y presione la opcin 4.  

## 2021-09-25 ENCOUNTER — Encounter: Payer: Self-pay | Admitting: Dermatology

## 2021-11-17 ENCOUNTER — Ambulatory Visit: Payer: Medicare Other | Admitting: Dermatology

## 2021-11-29 ENCOUNTER — Other Ambulatory Visit: Payer: Self-pay | Admitting: Dermatology

## 2021-11-29 DIAGNOSIS — L409 Psoriasis, unspecified: Secondary | ICD-10-CM

## 2021-12-01 ENCOUNTER — Telehealth: Payer: Self-pay | Admitting: Family Medicine

## 2021-12-01 NOTE — Telephone Encounter (Signed)
PT's brother came in and stated that he was concerned for the patient's shoulder.  He wanted to speak with physician but I told him that I would send a message back.  Freida Busman is on the [facility] and he stated that he wanted to see if Mr. Ferraris could get a MRI on his shoulder as he is concerned regarding his rotator cuff.  Please advise.

## 2021-12-02 NOTE — Telephone Encounter (Signed)
Please advise, will patient need an appointment?

## 2021-12-03 NOTE — Telephone Encounter (Signed)
Let's get him in so the insurance won't give [place] trouble paying for it .

## 2021-12-03 NOTE — Telephone Encounter (Signed)
Returned call and scheduled appointment for tomorrow at 3:00 PM

## 2021-12-04 ENCOUNTER — Ambulatory Visit (INDEPENDENT_AMBULATORY_CARE_PROVIDER_SITE_OTHER): Payer: Medicare Other | Admitting: Family Medicine

## 2021-12-04 ENCOUNTER — Ambulatory Visit: Payer: Medicare Other

## 2021-12-04 ENCOUNTER — Ambulatory Visit
Admission: RE | Admit: 2021-12-04 | Discharge: 2021-12-04 | Disposition: A | Discharge status: Home / Self Care | Payer: Medicare Other | Source: Ambulatory Visit | Attending: Family Medicine | Admitting: Family Medicine

## 2021-12-04 ENCOUNTER — Encounter: Payer: Self-pay | Admitting: Family Medicine

## 2021-12-04 ENCOUNTER — Ambulatory Visit
Admission: RE | Admit: 2021-12-04 | Discharge: 2021-12-04 | Disposition: A | Discharge status: Home / Self Care | Payer: Medicare Other | Attending: Family Medicine | Admitting: Family Medicine

## 2021-12-04 VITALS — BP 144/76 | HR 65 | Temp 97.8°F | Wt 171.8 lb

## 2021-12-04 DIAGNOSIS — G8929 Other chronic pain: Secondary | ICD-10-CM | POA: Insufficient documentation

## 2021-12-04 DIAGNOSIS — M79605 Pain in left leg: Secondary | ICD-10-CM | POA: Diagnosis not present

## 2021-12-04 DIAGNOSIS — R634 Abnormal weight loss: Secondary | ICD-10-CM | POA: Insufficient documentation

## 2021-12-04 DIAGNOSIS — M545 Low back pain, unspecified: Secondary | ICD-10-CM

## 2021-12-04 DIAGNOSIS — M25512 Pain in left shoulder: Secondary | ICD-10-CM

## 2021-12-04 DIAGNOSIS — H6123 Impacted cerumen, bilateral: Secondary | ICD-10-CM | POA: Diagnosis not present

## 2021-12-04 DIAGNOSIS — B001 Herpesviral vesicular dermatitis: Secondary | ICD-10-CM | POA: Diagnosis not present

## 2021-12-04 NOTE — Patient Instructions (Addendum)
12/04/21 171 lb 12.8 oz (77.9 kg)  07/10/21 177 lb (80.3 kg)  04/18/21 182 lb 9.6 oz (82.8 kg)   03/18/20 180 lb 3.2 oz (81.7 kg)  03/18/20 180 lb (81.6 kg)  08/28/19 175 lb 6.4 oz (79.6 kg)   02/28/19 168 lb 8 oz (76.4 kg)  06/23/18 178 lb (80.7 kg)   03/07/18 175 lb 7 oz (79.6 kg)  12/23/17 175 lb 11.2 oz (79.7 kg)  08/03/17 181 lb (82.1 kg)   02/26/17 178 lb 6 oz (80.9 kg)  01/28/17 178 lb 5 oz (80.9 kg)  02/17/16 183 lb (83 kg)   07/31/15 177 lb (80.287 kg)  05/22/14 190 lb (86.183 kg)

## 2021-12-04 NOTE — Progress Notes (Signed)
BP (!) 144/76   Pulse 65   Temp 97.8 F (36.6 C)   Wt 171 lb 12.8 oz (77.9 kg)   SpO2 98%   BMI 26.91 kg/m    Subjective:    Patient ID: [name], male    DOB: 1961-02-10, 61 y.o.   MRN: [id number]  HPI: YANDIEL BERGUM is a 61 y.o. male  Chief Complaint  Patient presents with   Shoulder Pain    Patient is here with brother today, patient complains about ongoing shoulder pain. Had x-rays last year, and would like MRI scheduled.    SHOULDER PAIN Duration: about 9 months Involved shoulder: left Mechanism of injury: unknown Location: diffuse joint Onset:gradual Severity: moderate  Quality:  aching and sore Frequency: constant Radiation: no Aggravating factors: lifting  Alleviating factors: nothing  Status: worse Treatments attempted: rest, ice, heat, APAP, ibuprofen, aleve, and HEP  Relief with NSAIDs?:  mild Weakness: no Numbness: no Decreased grip strength: no Redness: no Swelling: no Bruising: no Fevers: no  Relevant past medical, surgical, family and social history reviewed and updated as indicated. Interim medical history since our last visit reviewed. Allergies and medications reviewed and updated.  Review of Systems  Constitutional: Negative.   Respiratory: Negative.    Cardiovascular: Negative.   Gastrointestinal: Negative.   Musculoskeletal:  Positive for arthralgias and myalgias. Negative for back pain, gait problem, joint swelling, neck pain and neck stiffness.  Skin: Negative.   Neurological: Negative.   Psychiatric/Behavioral: Negative.      Per HPI unless specifically indicated above     Objective:    BP (!) 144/76   Pulse 65   Temp 97.8 F (36.6 C)   Wt 171 lb 12.8 oz (77.9 kg)   SpO2 98%   BMI 26.91 kg/m   Wt Readings from Last 3 Encounters:  12/04/21 171 lb 12.8 oz (77.9 kg)  07/10/21 177 lb (80.3 kg)  04/18/21 182 lb 9.6 oz (82.8 kg)    Physical Exam Vitals and nursing note reviewed.  Constitutional:      General: He is not  in acute distress.    Appearance: Normal appearance. He is not ill-appearing, toxic-appearing or diaphoretic.  HENT:     Head: Normocephalic and atraumatic.     Right Ear: External ear normal.     Left Ear: External ear normal.     Nose: Nose normal.     Mouth/Throat:     Mouth: Mucous membranes are moist.     Pharynx: Oropharynx is clear.  Eyes:     General: No scleral icterus.       Right eye: No discharge.        Left eye: No discharge.     Extraocular Movements: Extraocular movements intact.     Conjunctiva/sclera: Conjunctivae normal.     Pupils: Pupils are equal, round, and reactive to light.  Cardiovascular:     Rate and Rhythm: Normal rate and regular rhythm.     Pulses: Normal pulses.     Heart sounds: Normal heart sounds. No murmur heard.    No friction rub. No gallop.  Pulmonary:     Effort: Pulmonary effort is normal. No respiratory distress.     Breath sounds: Normal breath sounds. No stridor. No wheezing, rhonchi or rales.  Chest:     Chest wall: No tenderness.  Musculoskeletal:        General: Tenderness present. No swelling, deformity or signs of injury.     Cervical back: Normal  range of motion and neck supple.     Right lower leg: No edema.     Left lower leg: No edema.  Skin:    General: Skin is warm and dry.     Capillary Refill: Capillary refill takes less than 2 seconds.     Coloration: Skin is not jaundiced or pale.     Findings: No bruising, erythema, lesion or rash.  Neurological:     General: No focal deficit present.     Mental Status: He is alert and oriented to person, place, and time. Mental status is at baseline.  Psychiatric:        Mood and Affect: Mood normal.        Behavior: Behavior normal.        Thought Content: Thought content normal.        Judgment: Judgment normal.     Results for orders placed or performed in visit on 07/10/21  Comprehensive metabolic panel  Result Value Ref Range   Glucose 110 (H) 70 - 99 mg/dL   BUN 12  8 - 27 mg/dL   Creatinine, Ser 0.88 0.76 - 1.27 mg/dL   eGFR 98 >59 mL/min/1.73   BUN/Creatinine Ratio 14 10 - 24   Sodium 137 134 - 144 mmol/L   Potassium 4.2 3.5 - 5.2 mmol/L   Chloride 100 96 - 106 mmol/L   CO2 26 20 - 29 mmol/L   Calcium 9.5 8.6 - 10.2 mg/dL   Total Protein 7.2 6.0 - 8.5 g/dL   Albumin 4.0 3.8 - 4.8 g/dL   Globulin, Total 3.2 1.5 - 4.5 g/dL   Albumin/Globulin Ratio 1.3 1.2 - 2.2   Bilirubin Total 0.8 0.0 - 1.2 mg/dL   Alkaline Phosphatase 100 44 - 121 IU/L   AST 9 0 - 40 IU/L   ALT 25 0 - 44 IU/L  Lipid Panel w/o Chol/HDL Ratio  Result Value Ref Range   Cholesterol, Total 201 (H) 100 - 199 mg/dL   Triglycerides 156 (H) 0 - 149 mg/dL   HDL 49 >39 mg/dL   VLDL Cholesterol Cal 28 5 - 40 mg/dL   LDL Chol Calc (NIH) 124 (H) 0 - 99 mg/dL  Microalbumin, Urine Waived  Result Value Ref Range   Microalb, Ur Waived 10 0 - 19 mg/L   Creatinine, Urine Waived 50 10 - 300 mg/dL   Microalb/Creat Ratio <30 <30 mg/g  CBC with Differential/Platelet  Result Value Ref Range   WBC 7.4 3.4 - 10.8 x10E3/uL   RBC 4.64 4.14 - 5.80 x10E6/uL   Hemoglobin 14.4 13.0 - 17.7 g/dL   Hematocrit 41.6 37.5 - 51.0 %   MCV 90 79 - 97 fL   MCH 31.0 26.6 - 33.0 pg   MCHC 34.6 31.5 - 35.7 g/dL   RDW 12.4 11.6 - 15.4 %   Platelets 221 150 - 450 x10E3/uL   Neutrophils 56 Not Estab. %   Lymphs 34 Not Estab. %   Monocytes 7 Not Estab. %   Eos 1 Not Estab. %   Basos 1 Not Estab. %   Neutrophils Absolute 4.2 1.4 - 7.0 x10E3/uL   Lymphocytes Absolute 2.5 0.7 - 3.1 x10E3/uL   Monocytes Absolute 0.5 0.1 - 0.9 x10E3/uL   EOS (ABSOLUTE) 0.1 0.0 - 0.4 x10E3/uL   Basophils Absolute 0.1 0.0 - 0.2 x10E3/uL   Immature Granulocytes 1 Not Estab. %   Immature Grans (Abs) 0.0 0.0 - 0.1 x10E3/uL  TSH  Result Value Ref Range  TSH 1.760 0.450 - 4.500 uIU/mL  PSA  Result Value Ref Range   Prostate Specific Ag, Serum 2.1 0.0 - 4.0 ng/mL  Urinalysis, Routine w reflex microscopic  Result Value Ref Range    Specific Gravity, UA 1.010 1.005 - 1.030   pH, UA 5.5 5.0 - 7.5   Color, UA Yellow Yellow   Appearance Ur Clear Clear   Leukocytes,UA Negative Negative   Protein,UA Negative Negative/Trace   Glucose, UA Negative Negative   Ketones, UA Negative Negative   RBC, UA Negative Negative   Bilirubin, UA Negative Negative   Urobilinogen, Ur 0.2 0.2 - 1.0 mg/dL   Nitrite, UA Negative Negative      Assessment & Plan:   Problem List Items Addressed This Visit   None Visit Diagnoses     Chronic left shoulder pain    -  Primary   No better. Will get him MRI. Await results. Treat as needed.   Relevant Orders   MR Shoulder Left Wo Contrast   Chronic bilateral low back pain, unspecified whether sciatica present       x-ray ordered. Await results.    Relevant Orders   DG Lumbar Spine Complete   Weight loss       Weight has usually gone down in the summer and up in the winter for the past several years- will check blood work and CXR to r/o other issues.    Relevant Orders   Comprehensive metabolic panel   CBC with Differential/Platelet   TSH   DG Chest 2 View        Follow up plan: Return if symptoms worsen or fail to improve.

## 2021-12-05 LAB — COMPREHENSIVE METABOLIC PANEL
ALT: 15 IU/L (ref 0–44)
AST: 9 IU/L (ref 0–40)
Albumin/Globulin Ratio: 0.9 — ABNORMAL LOW (ref 1.2–2.2)
Albumin: 3.2 g/dL — ABNORMAL LOW (ref 3.9–4.9)
Alkaline Phosphatase: 104 IU/L (ref 44–121)
BUN/Creatinine Ratio: 14 (ref 10–24)
BUN: 13 mg/dL (ref 8–27)
Bilirubin Total: 0.3 mg/dL (ref 0.0–1.2)
CO2: 26 mmol/L (ref 20–29)
Calcium: 9 mg/dL (ref 8.6–10.2)
Chloride: 101 mmol/L (ref 96–106)
Creatinine, Ser: 0.91 mg/dL (ref 0.76–1.27)
Globulin, Total: 3.7 g/dL (ref 1.5–4.5)
Glucose: 156 mg/dL — ABNORMAL HIGH (ref 70–99)
Potassium: 3.9 mmol/L (ref 3.5–5.2)
Sodium: 139 mmol/L (ref 134–144)
Total Protein: 6.9 g/dL (ref 6.0–8.5)
eGFR: 95 mL/min/{1.73_m2} (ref >59)

## 2021-12-05 LAB — CBC WITH DIFFERENTIAL/PLATELET
Basophils Absolute: 0 10*3/uL (ref 0.0–0.2)
Basos: 1 %
EOS (ABSOLUTE): 0.2 10*3/uL (ref 0.0–0.4)
Eos: 3 %
Hematocrit: 37.8 % (ref 37.5–51.0)
Hemoglobin: 12.6 g/dL — ABNORMAL LOW (ref 13.0–17.7)
Immature Grans (Abs): 0 10*3/uL (ref 0.0–0.1)
Immature Granulocytes: 1 %
Lymphocytes Absolute: 2.6 10*3/uL (ref 0.7–3.1)
Lymphs: 40 %
MCH: 29.9 pg (ref 26.6–33.0)
MCHC: 33.3 g/dL (ref 31.5–35.7)
MCV: 90 fL (ref 79–97)
Monocytes Absolute: 0.5 10*3/uL (ref 0.1–0.9)
Monocytes: 8 %
Neutrophils Absolute: 3.1 10*3/uL (ref 1.4–7.0)
Neutrophils: 47 %
Platelets: 203 10*3/uL (ref 150–450)
RBC: 4.21 x10E6/uL (ref 4.14–5.80)
RDW: 13.1 % (ref 11.6–15.4)
WBC: 6.4 10*3/uL (ref 3.4–10.8)

## 2021-12-05 LAB — TSH: TSH: 1.53 u[IU]/mL (ref 0.450–4.500)

## 2021-12-12 ENCOUNTER — Ambulatory Visit
Admission: RE | Admit: 2021-12-12 | Discharge: 2021-12-12 | Disposition: A | Discharge status: Home / Self Care | Payer: Medicare Other | Source: Ambulatory Visit | Attending: Family Medicine | Admitting: Family Medicine

## 2021-12-12 DIAGNOSIS — G8929 Other chronic pain: Secondary | ICD-10-CM | POA: Diagnosis not present

## 2021-12-12 DIAGNOSIS — M25512 Pain in left shoulder: Secondary | ICD-10-CM | POA: Diagnosis not present

## 2021-12-16 ENCOUNTER — Other Ambulatory Visit: Payer: Self-pay | Admitting: Family Medicine

## 2021-12-16 DIAGNOSIS — M75112 Incomplete rotator cuff tear or rupture of left shoulder, not specified as traumatic: Secondary | ICD-10-CM

## 2021-12-18 ENCOUNTER — Telehealth: Payer: Self-pay | Admitting: Family Medicine

## 2021-12-18 NOTE — Telephone Encounter (Signed)
See result note.  

## 2021-12-18 NOTE — Telephone Encounter (Signed)
Pt's brother Freida Busman called for the results on the MRI that he had on the 1st of hs shoulder.  They have not heard anything back  CB@  [phone]

## 2021-12-22 DIAGNOSIS — M7542 Impingement syndrome of left shoulder: Secondary | ICD-10-CM | POA: Diagnosis not present

## 2021-12-31 ENCOUNTER — Ambulatory Visit (INDEPENDENT_AMBULATORY_CARE_PROVIDER_SITE_OTHER): Payer: Medicare Other | Admitting: Dermatology

## 2021-12-31 DIAGNOSIS — L578 Other skin changes due to chronic exposure to nonionizing radiation: Secondary | ICD-10-CM

## 2021-12-31 DIAGNOSIS — L570 Actinic keratosis: Secondary | ICD-10-CM | POA: Diagnosis not present

## 2021-12-31 DIAGNOSIS — L409 Psoriasis, unspecified: Secondary | ICD-10-CM

## 2021-12-31 MED ORDER — KETOCONAZOLE 2 % EX SHAM: 1.0000 | MEDICATED_SHAMPOO | CUTANEOUS | 11 refills | Status: DC

## 2021-12-31 MED ORDER — MOMETASONE FUROATE 0.1 % EX SOLN: CUTANEOUS | 6 refills | Status: DC

## 2021-12-31 NOTE — Patient Instructions (Signed)
Cryotherapy Aftercare  Wash gently with soap and water everyday.   Apply Vaseline and Band-Aid daily until healed.     Due to recent changes in healthcare laws, you may see results of your pathology and/or laboratory studies on MyChart before the doctors have had a chance to review them. We understand that in some cases there may be results that are confusing or concerning to you. Please understand that not all results are received at the same time and often the doctors may need to interpret multiple results in order to provide you with the best plan of care or course of treatment. Therefore, we ask that you please give us 2 business days to thoroughly review all your results before contacting the office for clarification. Should we see a critical lab result, you will be contacted sooner.   If You Need Anything After Your Visit  If you have any questions or concerns for your doctor, please call our main line at [phone] and press option 4 to reach your doctor's medical assistant. If no one answers, please leave a voicemail as directed and we will return your call as soon as possible. Messages left after 4 pm will be answered the following business day.   You may also send us a message via MyChart. We typically respond to MyChart messages within 1-2 business days.  For prescription refills, please ask your pharmacy to contact our office. Our fax number is [phone].  If you have an urgent issue when the clinic is closed that cannot wait until the next business day, you can page your doctor at the number below.    Please note that while we do our best to be available for urgent issues outside of office hours, we are not available 24/7.   If you have an urgent issue and are unable to reach us, you may choose to seek medical care at your doctor's office, retail clinic, urgent care center, or emergency room.  If you have a medical emergency, please immediately call 911 or go to the  emergency department.  Pager Numbers  - Dr. Kowalski: [phone]  - Dr. Moye: [phone]  - Dr. Stewart: [phone]  In the event of inclement weather, please call our main line at [phone] for an update on the status of any delays or closures.  Dermatology Medication Tips: Please keep the boxes that topical medications come in in order to help keep track of the instructions about where and how to use these. Pharmacies typically print the medication instructions only on the boxes and not directly on the medication tubes.   If your medication is too expensive, please contact our office at [phone] option 4 or send us a message through MyChart.   We are unable to tell what your co-pay for medications will be in advance as this is different depending on your insurance coverage. However, we may be able to find a substitute medication at lower cost or fill out paperwork to get insurance to cover a needed medication.   If a prior authorization is required to get your medication covered by your insurance company, please allow us 1-2 business days to complete this process.  Drug prices often vary depending on where the prescription is filled and some pharmacies may offer cheaper prices.  The website www.goodrx.com contains coupons for medications through different pharmacies. The prices here do not account for what the cost may be with help from insurance (it may be cheaper with your insurance), but the website can   give you the price if you did not use any insurance.  - You can print the associated coupon and take it with your prescription to the pharmacy.  - You may also stop by our office during regular business hours and pick up a GoodRx coupon card.  - If you need your prescription sent electronically to a different pharmacy, notify our office through [facility] MyChart or by phone at [phone] option 4.     Si Usted Necesita Algo Despus de Su Visita  Tambin puede  enviarnos un mensaje a travs de MyChart. Por lo general respondemos a los mensajes de MyChart en el transcurso de 1 a 2 das hbiles.  Para renovar recetas, por favor pida a su farmacia que se ponga en contacto con nuestra oficina. Nuestro nmero de fax es el [phone].  Si tiene un asunto urgente cuando la clnica est cerrada y que no puede esperar hasta el siguiente da hbil, puede llamar/localizar a su doctor(a) al nmero que aparece a continuacin.   Por favor, tenga en cuenta que aunque hacemos todo lo posible para estar disponibles para asuntos urgentes fuera del horario de oficina, no estamos disponibles las 24 horas del da, los 7 das de la semana.   Si tiene un problema urgente y no puede comunicarse con nosotros, puede optar por buscar atencin mdica  en el consultorio de su doctor(a), en una clnica privada, en un centro de atencin urgente o en una sala de emergencias.  Si tiene una emergencia mdica, por favor llame inmediatamente al 911 o vaya a la sala de emergencias.  Nmeros de bper  - Dr. Kowalski: [phone]  - Dra. Moye: [phone]  - Dra. Stewart: [phone]  En caso de inclemencias del tiempo, por favor llame a nuestra lnea principal al [phone] para una actualizacin sobre el estado de cualquier retraso o cierre.  Consejos para la medicacin en dermatologa: Por favor, guarde las cajas en las que vienen los medicamentos de uso tpico para ayudarle a seguir las instrucciones sobre dnde y cmo usarlos. Las farmacias generalmente imprimen las instrucciones del medicamento slo en las cajas y no directamente en los tubos del medicamento.   Si su medicamento es muy caro, por favor, pngase en contacto con nuestra oficina llamando al [phone] y presione la opcin 4 o envenos un mensaje a travs de MyChart.   No podemos decirle cul ser su copago por los medicamentos por adelantado ya que esto es diferente dependiendo de la cobertura de su seguro.  Sin embargo, es posible que podamos encontrar un medicamento sustituto a menor costo o llenar un formulario para que el seguro cubra el medicamento que se considera necesario.   Si se requiere una autorizacin previa para que su compaa de seguros cubra su medicamento, por favor permtanos de 1 a 2 das hbiles para completar este proceso.  Los precios de los medicamentos varan con frecuencia dependiendo del lugar de dnde se surte la receta y alguna farmacias pueden ofrecer precios ms baratos.  El sitio web www.goodrx.com tiene cupones para medicamentos de diferentes farmacias. Los precios aqu no tienen en cuenta lo que podra costar con la ayuda del seguro (puede ser ms barato con su seguro), pero el sitio web puede darle el precio si no utiliz ningn seguro.  - Puede imprimir el cupn correspondiente y llevarlo con su receta a la farmacia.  - Tambin puede pasar por nuestra oficina durante el horario de atencin regular y recoger una tarjeta de cupones de GoodRx.  -   Si necesita que su receta se enve electrnicamente a una farmacia diferente, informe a nuestra oficina a travs de MyChart de [place] o por telfono llamando al [phone] y presione la opcin 4.  

## 2022-01-05 ENCOUNTER — Encounter: Payer: Self-pay | Admitting: Dermatology

## 2022-01-05 NOTE — Progress Notes (Signed)
   Follow-Up Visit   Subjective  Bryan West is a 61 y.o. male who presents for the following: Psoriasis (3 month follow up - still having an issue with his sacral area but his scalp is doing very well/). The patient has spots, moles and lesions to be evaluated, some may be new or changing and the patient has concerns that these could be cancer.  The following portions of the chart were reviewed this encounter and updated as appropriate:   Tobacco  Allergies  Meds  Problems  Med Hx  Surg Hx  Fam Hx     Review of Systems:  No other skin or systemic complaints except as noted in HPI or Assessment and Plan.  Objective  Well appearing patient in no apparent distress; mood and affect are within normal limits.  A focused examination was performed including face, scalp, trunk. Relevant physical exam findings are noted in the Assessment and Plan.  Ears x 6, face x 3 (9) Erythematous thin papules/macules with gritty scale.   Sacral area, scalp 6.5 cm psoriatic plaque   Assessment & Plan   Actinic Damage - chronic, secondary to cumulative UV radiation exposure/sun exposure over time - diffuse scaly erythematous macules with underlying dyspigmentation - Recommend daily broad spectrum sunscreen SPF 30+ to sun-exposed areas, reapply every 2 hours as needed.  - Recommend staying in the shade or wearing long sleeves, sun glasses (UVA+UVB protection) and wide brim hats (4-inch brim around the entire circumference of the hat). - Call for new or changing lesions.  AK (actinic keratosis) (9) Ears x 6, face x 3 Destruction of lesion - Ears x 6, face x 3 Complexity: simple   Destruction method: cryotherapy   Informed consent: discussed and consent obtained   Timeout:  patient name, date of birth, surgical site, and procedure verified Lesion destroyed using liquid nitrogen: Yes   Region frozen until ice ball extended beyond lesion: Yes   Outcome: patient tolerated procedure well with no  complications   Post-procedure details: wound care instructions given    Psoriasis Sacral area, scalp Chronic and persistent condition with duration or expected duration over one year. Condition is symptomatic / bothersome to patient. Not to goal, but much improved and less itchy than before and much thinner.  Psoriasis is a chronic non-curable, but treatable genetic/hereditary disease that may have other systemic features affecting other organ systems such as joints (Psoriatic Arthritis). It is associated with an increased risk of inflammatory bowel disease, heart disease, non-alcoholic fatty liver disease, and depression.     Vtama samples given to use qd to sacral area. Advised may not be covered by insurance, but if not and not affordable, can give samples when available if Vtama works for pt. Pt has done pretty well with topical Zoryve but not covered by insurance and we do not have samples.  Ketoconazole (NIZORAL) 2 % shampoo Apply 1 application topically 3 (three) times a week. Wash scalp 3 times weekly, let sit 5 minutes before rinsing out   mometasone (ELOCON) 0.1 % lotion Apply once a day up to 5 days a week to affected areas as needed for flares    Return in about 6 months (around 07/01/2022) for Psoriasis.  I, Ashok Cordia, CMA, am acting as scribe for Sarina Ser, MD . Documentation: I have reviewed the above documentation for accuracy and completeness, and I agree with the above.  Sarina Ser, MD

## 2022-01-07 ENCOUNTER — Other Ambulatory Visit: Payer: Self-pay | Admitting: Family Medicine

## 2022-01-07 MED ORDER — LOSARTAN POTASSIUM 50 MG PO TABS: 50.0000 mg | ORAL_TABLET | Freq: Every day | ORAL | 0 refills | Status: DC

## 2022-01-07 NOTE — Telephone Encounter (Signed)
Requested Prescriptions  Pending Prescriptions Disp Refills  . losartan (COZAAR) 50 MG tablet 90 tablet 0    Sig: Take 1 tablet (50 mg total) by mouth daily.     Cardiovascular:  Angiotensin Receptor Blockers Failed - 01/07/2022  9:24 AM      Failed - Last BP in normal range    BP Readings from Last 1 Encounters:  12/04/21 (!) 144/76         Passed - Cr in normal range and within 180 days    Creatinine, Ser  Date Value Ref Range Status  12/04/2021 0.91 0.76 - 1.27 mg/dL Final         Passed - K in normal range and within 180 days    Potassium  Date Value Ref Range Status  12/04/2021 3.9 3.5 - 5.2 mmol/L Final         Passed - Patient is not pregnant      Passed - Valid encounter within last 6 months    Recent Outpatient Visits          1 month ago Chronic left shoulder pain   [place], [place], [place]   6 months ago Primary hypertension   [place], [place], DO   8 months ago Chronic left shoulder pain   [facility] [place], [place], DO   12 months ago Mixed hyperlipidemia   [employer], [place], DO   1 year ago Primary hypertension   [place], Megan P, DO      Future Appointments            In 4 months Wynetta Emery, Barb Merino, DO [employer], [place]   In 5 months Nehemiah Massed, Monia Sabal, MD [place]

## 2022-01-07 NOTE — Telephone Encounter (Signed)
Medication Refill - Medication: losartan (COZAAR) 50 MG tablet  Has the patient contacted their pharmacy? No. Pt had appt Monday, but was seen in Aug. Pt rescheduled Monday's appt, as brother felt there is no need to come at this time. Preferred Pharmacy (with phone number or street name): [facility] - [place], [place]  Has the patient been seen for an appointment in the last year OR does the patient have an upcoming appointment? Yes.    Agent: Please be advised that RX refills may take up to 3 business days. We ask that you follow-up with your pharmacy.

## 2022-01-12 ENCOUNTER — Ambulatory Visit: Payer: Medicare Other | Admitting: Family Medicine

## 2022-01-28 ENCOUNTER — Other Ambulatory Visit: Payer: Self-pay | Admitting: Dermatology

## 2022-01-28 DIAGNOSIS — L409 Psoriasis, unspecified: Secondary | ICD-10-CM

## 2022-02-09 ENCOUNTER — Ambulatory Visit (INDEPENDENT_AMBULATORY_CARE_PROVIDER_SITE_OTHER): Payer: Medicare Other

## 2022-02-09 DIAGNOSIS — Z23 Encounter for immunization: Secondary | ICD-10-CM

## 2022-05-19 ENCOUNTER — Ambulatory Visit (INDEPENDENT_AMBULATORY_CARE_PROVIDER_SITE_OTHER): Payer: Medicare Other

## 2022-05-19 VITALS — Wt 171.0 lb

## 2022-05-19 DIAGNOSIS — Z0000 Encounter for general adult medical examination without abnormal findings: Secondary | ICD-10-CM | POA: Diagnosis not present

## 2022-05-19 NOTE — Patient Instructions (Signed)
[name] , Thank you for taking time to come for your Medicare Wellness Visit. I appreciate your ongoing commitment to your health goals. Please review the following plan we discussed and let me know if I can assist you in the future.   These are the goals we discussed:  Goals      DIET - EAT MORE FRUITS AND VEGETABLES     Patient Stated     03/18/2020, no goals     Patient Stated     No goals        This is a list of the screening recommended for you and due dates:  Health Maintenance  Topic Date Due   COVID-19 Vaccine (1) Never done   Zoster (Shingles) Vaccine (1 of 2) Never done   Medicare Annual Wellness Visit  05/20/2023   Cologuard (Stool DNA test)  03/03/2024   DTaP/Tdap/Td vaccine (3 - Tdap) 06/18/2027   Flu Shot  Completed   Hepatitis C Screening: USPSTF Recommendation to screen - Ages 18-79 yo.  Completed   HIV Screening  Completed   HPV Vaccine  Aged Out   Colon Cancer Screening  Discontinued    Advanced directives: no  Conditions/risks identified: none  Next appointment: Follow up in one year for your annual wellness visit 05/25/23 @ 10:15 am by phone  Preventive Care 40-64 Years, Male Preventive care refers to lifestyle choices and visits with your health care provider that can promote health and wellness. What does preventive care include? A yearly physical exam. This is also called an annual well check. Dental exams once or twice a year. Routine eye exams. Ask your health care provider how often you should have your eyes checked. Personal lifestyle choices, including: Daily care of your teeth and gums. Regular physical activity. Eating a healthy diet. Avoiding tobacco and drug use. Limiting alcohol use. Practicing safe sex. Taking low-dose aspirin every day starting at age 7. What happens during an annual well check? The services and screenings done by your health care provider during your annual well check will depend on your age, overall health,  lifestyle risk factors, and family history of disease. Counseling  Your health care provider may ask you questions about your: Alcohol use. Tobacco use. Drug use. Emotional well-being. Home and relationship well-being. Sexual activity. Eating habits. Work and work Statistician. Screening  You may have the following tests or measurements: Height, weight, and BMI. Blood pressure. Lipid and cholesterol levels. These may be checked every 5 years, or more frequently if you are over 37 years old. Skin check. Lung cancer screening. You may have this screening every year starting at age 75 if you have a 30-pack-year history of smoking and currently smoke or have quit within the past 15 years. Fecal occult blood test (FOBT) of the stool. You may have this test every year starting at age 6. Flexible sigmoidoscopy or colonoscopy. You may have a sigmoidoscopy every 5 years or a colonoscopy every 10 years starting at age 63. Prostate cancer screening. Recommendations will vary depending on your family history and other risks. Hepatitis C blood test. Hepatitis B blood test. Sexually transmitted disease (STD) testing. Diabetes screening. This is done by checking your blood sugar (glucose) after you have not eaten for a while (fasting). You may have this done every 1-3 years. Discuss your test results, treatment options, and if necessary, the need for more tests with your health care provider. Vaccines  Your health care provider may recommend certain vaccines, such as:  Influenza vaccine. This is recommended every year. Tetanus, diphtheria, and acellular pertussis (Tdap, Td) vaccine. You may need a Td booster every 10 years. Zoster vaccine. You may need this after age 55. Pneumococcal 13-valent conjugate (PCV13) vaccine. You may need this if you have certain conditions and have not been vaccinated. Pneumococcal polysaccharide (PPSV23) vaccine. You may need one or two doses if you smoke cigarettes or  if you have certain conditions. Talk to your health care provider about which screenings and vaccines you need and how often you need them. This information is not intended to replace advice given to you by your health care provider. Make sure you discuss any questions you have with your health care provider. Document Released: 04/26/2015 Document Revised: 12/18/2015 Document Reviewed: 01/29/2015 Elsevier Interactive Patient Education  2017 [employer] Prevention in the Home Falls can cause injuries. They can happen to people of all ages. There are many things you can do to make your home safe and to help prevent falls. What can I do on the outside of my home? Regularly fix the edges of walkways and driveways and fix any cracks. Remove anything that might make you trip as you walk through a door, such as a raised step or threshold. Trim any bushes or trees on the path to your home. Use bright outdoor lighting. Clear any walking paths of anything that might make someone trip, such as rocks or tools. Regularly check to see if handrails are loose or broken. Make sure that both sides of any steps have handrails. Any raised decks and porches should have guardrails on the edges. Have any leaves, snow, or ice cleared regularly. Use sand or salt on walking paths during winter. Clean up any spills in your garage right away. This includes oil or grease spills. What can I do in the bathroom? Use night lights. Install grab bars by the toilet and in the tub and shower. Do not use towel bars as grab bars. Use non-skid mats or decals in the tub or shower. If you need to sit down in the shower, use a plastic, non-slip stool. Keep the floor dry. Clean up any water that spills on the floor as soon as it happens. Remove soap buildup in the tub or shower regularly. Attach bath mats securely with double-sided non-slip rug tape. Do not have throw rugs and other things on the floor that can make you  trip. What can I do in the bedroom? Use night lights. Make sure that you have a light by your bed that is easy to reach. Do not use any sheets or blankets that are too big for your bed. They should not hang down onto the floor. Have a firm chair that has side arms. You can use this for support while you get dressed. Do not have throw rugs and other things on the floor that can make you trip. What can I do in the kitchen? Clean up any spills right away. Avoid walking on wet floors. Keep items that you use a lot in easy-to-reach places. If you need to reach something above you, use a strong step stool that has a grab bar. Keep electrical cords out of the way. Do not use floor polish or wax that makes floors slippery. If you must use wax, use non-skid floor wax. Do not have throw rugs and other things on the floor that can make you trip. What can I do with my stairs? Do not leave any items on  the stairs. Make sure that there are handrails on both sides of the stairs and use them. Fix handrails that are broken or loose. Make sure that handrails are as long as the stairways. Check any carpeting to make sure that it is firmly attached to the stairs. Fix any carpet that is loose or worn. Avoid having throw rugs at the top or bottom of the stairs. If you do have throw rugs, attach them to the floor with carpet tape. Make sure that you have a light switch at the top of the stairs and the bottom of the stairs. If you do not have them, ask someone to add them for you. What else can I do to help prevent falls? Wear shoes that: Do not have high heels. Have rubber bottoms. Are comfortable and fit you well. Are closed at the toe. Do not wear sandals. If you use a stepladder: Make sure that it is fully opened. Do not climb a closed stepladder. Make sure that both sides of the stepladder are locked into place. Ask someone to hold it for you, if possible. Clearly mark and make sure that you can  see: Any grab bars or handrails. First and last steps. Where the edge of each step is. Use tools that help you move around (mobility aids) if they are needed. These include: Canes. Walkers. Scooters. Crutches. Turn on the lights when you go into a dark area. Replace any light bulbs as soon as they burn out. Set up your furniture so you have a clear path. Avoid moving your furniture around. If any of your floors are uneven, fix them. If there are any pets around you, be aware of where they are. Review your medicines with your doctor. Some medicines can make you feel dizzy. This can increase your chance of falling. Ask your doctor what other things that you can do to help prevent falls. This information is not intended to replace advice given to you by your health care provider. Make sure you discuss any questions you have with your health care provider. Document Released: 01/24/2009 Document Revised: 09/05/2015 Document Reviewed: 05/04/2014 Elsevier Interactive Patient Education  2017 [employer].

## 2022-05-19 NOTE — Progress Notes (Signed)
Virtual Visit via Telephone Note  I connected with  Calen C Sheer on 05/19/22 at 11:30 AM EST by telephone and verified that I am speaking with the correct person using two identifiers.Spoke w/ Dionicia Abler, brother  Location: Patient: home Provider: [facility] Persons participating in the virtual visit: patient/Nurse Health Advisor   I discussed the limitations, risks, security and privacy concerns of performing an evaluation and management service by telephone and the availability of in person appointments. The patient expressed understanding and agreed to proceed.  Interactive audio and video telecommunications were attempted between this nurse and patient, however failed, due to patient having technical difficulties OR patient did not have access to video capability.  We continued and completed visit with audio only.  Some vital signs may be absent or patient reported.   Dionisio David, LPN  Subjective:   DAESHAWN REDMANN is a 62 y.o. male who presents for Medicare Annual/Subsequent preventive examination.  Review of Systems     Cardiac Risk Factors include: advanced age (>30men, >80 women);hypertension;male gender     Objective:    There were no vitals filed for this visit. There is no height or weight on file to calculate BMI.     05/19/2022   11:34 AM 03/19/2021   10:13 AM 03/18/2020   10:19 AM  Advanced Directives  Does Patient Have a Medical Advance Directive? No No No  Would patient like information on creating a medical advance directive? No - Patient declined No - Patient declined     Current Medications (verified) Outpatient Encounter Medications as of 05/19/2022  Medication Sig   calcipotriene-betamethasone (TACLONEX) ointment APPLY TO AFFECTED AREAS DAILY IF NEEDED FOR FLARES   ketoconazole (NIZORAL) 2 % shampoo    ketoconazole (NIZORAL) 2 % shampoo Apply 1 Application topically 3 (three) times a week. Wash scalp 3 times weekly, let sit 5 minutes before rinsing out   losartan  (COZAAR) 50 MG tablet Take 1 tablet (50 mg total) by mouth daily.   meloxicam (MOBIC) 15 MG tablet Take 1 tablet (15 mg total) by mouth daily.   mometasone (ELOCON) 0.1 % cream Apply ONCE A DAY topically up to 5 days a week to psoriasis on buttocks or body as needed for flares   mometasone (ELOCON) 0.1 % lotion Daily up to 5 days per week prn flares   No facility-administered encounter medications on file as of 05/19/2022.    Allergies (verified) Patient has no known allergies.   History: Past Medical History:  Diagnosis Date   Hyperbilirubinemia    Hyperlipidemia    Hypertension    Lymphocytosis    MR (mental retardation)    Prostatitis    Prostatitis    Past Surgical History:  Procedure Laterality Date   CHOLECYSTECTOMY     Family History  Problem Relation Age of Onset   Pulmonary fibrosis Mother    Hypertension Father    Cancer Father        prostate   Cancer Brother        skin   Hypertension Brother    Social History   Socioeconomic History   Marital status: Single    Spouse name: Not on file   Number of children: Not on file   Years of education: Not on file   Highest education level: Not on file  Occupational History   Not on file  Tobacco Use   Smoking status: Never   Smokeless tobacco: Never  Vaping Use   Vaping Use: Never  used  Substance and Sexual Activity   Alcohol use: No   Drug use: No   Sexual activity: Not on file  Other Topics Concern   Not on file  Social History Narrative   Not on file   Social Determinants of Health   Financial Resource Strain: Low Risk  (05/19/2022)   Overall Financial Resource Strain (CARDIA)    Difficulty of Paying Living Expenses: Not hard at all  Food Insecurity: No Food Insecurity (05/19/2022)   Hunger Vital Sign    Worried About Running Out of Food in the Last Year: Never true    Ran Out of Food in the Last Year: Never true  Transportation Needs: No Transportation Needs (05/19/2022)   PRAPARE - Armed forces logistics/support/administrative officer (Medical): No    Lack of Transportation (Non-Medical): No  Physical Activity: Insufficiently Active (05/19/2022)   Exercise Vital Sign    Days of Exercise per Week: 3 days    Minutes of Exercise per Session: 30 min  Stress: No Stress Concern Present (05/19/2022)   [place]    Feeling of Stress : Not at all  Social Connections: Moderately Isolated (05/19/2022)   Social Connection and Isolation Panel [NHANES]    Frequency of Communication with Friends and Family: More than three times a week    Frequency of Social Gatherings with Friends and Family: More than three times a week    Attends Religious Services: More than 4 times per year    Active Member of [employer] or Organizations: No    Attends Music therapist: Never    Marital Status: Never married    Tobacco Counseling Counseling given: Not Answered   Clinical Intake:  Pre-visit preparation completed: Yes  Pain : No/denies pain     Nutritional Risks: None Diabetes: No  How often do you need to have someone help you when you read instructions, pamphlets, or other written materials from your doctor or pharmacy?: 1 - Never  Diabetic?no  Interpreter Needed?: No  Information entered by :: Kirke Shaggy, LPN   Activities of Daily Living    05/19/2022   11:36 AM  In your present state of health, do you have any difficulty performing the following activities:  Hearing? 0  Vision? 0  Difficulty concentrating or making decisions? 0  Walking or climbing stairs? 0  Dressing or bathing? 0  Doing errands, shopping? 0  Preparing Food and eating ? N  Using the Toilet? N  In the past six months, have you accidently leaked urine? N  Do you have problems with loss of bowel control? N  Managing your Medications? N  Managing your Finances? Y  Housekeeping or managing your Housekeeping? Y    Patient Care Team: Valerie Roys, DO  as PCP - General (Family Medicine)  Indicate any recent Medical Services you may have received from other than [facility] providers in the past year (date may be approximate).     Assessment:   This is a routine wellness examination for Desmin.  Hearing/Vision screen Hearing Screening - Comments:: No aids Vision Screening - Comments:: Readers - Dr.Porfilio  Dietary issues and exercise activities discussed: Current Exercise Habits: Home exercise routine, Type of exercise: walking, Time (Minutes): 30, Frequency (Times/Week): 3, Weekly Exercise (Minutes/Week): 90, Intensity: Mild   Goals Addressed             This Visit's Progress    DIET - EAT MORE FRUITS  AND VEGETABLES         Depression Screen    05/19/2022   11:33 AM 07/10/2021    1:12 PM 03/19/2021   10:12 AM 03/18/2020   10:38 AM 03/18/2020   10:20 AM 02/28/2019    9:48 AM 02/28/2019    9:16 AM  PHQ 2/9 Scores  PHQ - 2 Score 0 0 0 0 0 0   PHQ- 9 Score 0 0    0   Exception Documentation       Medical reason    Fall Risk    05/19/2022   11:35 AM 03/19/2021   10:06 AM 03/18/2020   10:20 AM 02/28/2019    9:16 AM 02/26/2017    3:26 PM  Fall Risk   Falls in the past year? 0 0 0 0 No  Number falls in past yr: 0 0  0   Injury with Fall? 0 0  0   Risk for fall due to : No Fall Risks  Medication side effect    Follow up Falls prevention discussed;Falls evaluation completed Falls evaluation completed;Falls prevention discussed Falls evaluation completed;Education provided      FALL RISK PREVENTION PERTAINING TO THE HOME:  Any stairs in or around the home? Yes  If so, are there any without handrails? No  Home free of loose throw rugs in walkways, pet beds, electrical cords, etc? Yes  Adequate lighting in your home to reduce risk of falls? Yes   ASSISTIVE DEVICES UTILIZED TO PREVENT FALLS:  Life alert? No  Use of a cane, walker or w/c? No  Grab bars in the bathroom? Yes  Shower chair or bench in shower? No  Elevated toilet  seat or a handicapped toilet? No    Cognitive Function:pt not available        02/26/2017    3:19 PM  6CIT Screen  What Year? 4 points  What month? 3 points    Immunizations Immunization History  Administered Date(s) Administered   Influenza,inj,Quad PF,6+ Mos 01/31/2016, 02/26/2017, 12/23/2017, 02/28/2019, 03/18/2020, 01/09/2021, 02/09/2022   Td 05/23/2007, 06/17/2017    TDAP status: Up to date  Flu Vaccine status: Up to date  Pneumococcal vaccine status: Declined,  Education has been provided regarding the importance of this vaccine but patient still declined. Advised may receive this vaccine at local pharmacy or Health Dept. Aware to provide a copy of the vaccination record if obtained from local pharmacy or Health Dept. Verbalized acceptance and understanding.   Covid-19 vaccine status: Declined, Education has been provided regarding the importance of this vaccine but patient still declined. Advised may receive this vaccine at local pharmacy or Health Dept.or vaccine clinic. Aware to provide a copy of the vaccination record if obtained from local pharmacy or Health Dept. Verbalized acceptance and understanding.  Qualifies for Shingles Vaccine? Yes   Zostavax completed No   Shingrix Completed?: No.    Education has been provided regarding the importance of this vaccine. Patient has been advised to call insurance company to determine out of pocket expense if they have not yet received this vaccine. Advised may also receive vaccine at local pharmacy or Health Dept. Verbalized acceptance and understanding.  Screening Tests Health Maintenance  Topic Date Due   COVID-19 Vaccine (1) Never done   Zoster Vaccines- Shingrix (1 of 2) Never done   Medicare Annual Wellness (AWV)  05/20/2023   Fecal DNA (Cologuard)  03/03/2024   DTaP/Tdap/Td (3 - Tdap) 06/18/2027   INFLUENZA VACCINE  Completed  Hepatitis C Screening  Completed   HIV Screening  Completed   HPV VACCINES  Aged Out    COLONOSCOPY (Pts 45-48yrs Insurance coverage will need to be confirmed)  Discontinued    Health Maintenance  Health Maintenance Due  Topic Date Due   COVID-19 Vaccine (1) Never done   Zoster Vaccines- Shingrix (1 of 2) Never done    Referral declined for colonoscopy  Lung Cancer Screening: (Low Dose CT Chest recommended if Age 27-80 years, 30 pack-year currently smoking OR have quit w/in 15years.) does not qualify.    Additional Screening:  Hepatitis C Screening: does qualify; Completed 02/28/19  Vision Screening: Recommended annual ophthalmology exams for early detection of glaucoma and other disorders of the eye. Is the patient up to date with their annual eye exam?  Yes  Who is the provider or what is the name of the office in which the patient attends annual eye exams? Dr. George Ina If pt is not established with a provider, would they like to be referred to a provider to establish care? No .   Dental Screening: Recommended annual dental exams for proper oral hygiene  Community Resource Referral / Chronic Care Management: CRR required this visit?  No   CCM required this visit?  No      Plan:     I have personally reviewed and noted the following in the patient's chart:   Medical and social history Use of alcohol, tobacco or illicit drugs  Current medications and supplements including opioid prescriptions. Patient is not currently taking opioid prescriptions. Functional ability and status Nutritional status Physical activity Advanced directives List of other physicians Hospitalizations, surgeries, and ER visits in previous 12 months Vitals Screenings to include cognitive, depression, and falls Referrals and appointments  In addition, I have reviewed and discussed with patient certain preventive protocols, quality metrics, and best practice recommendations. A written personalized care plan for preventive services as well as general preventive health recommendations  were provided to patient.     Dionisio David, LPN   [id number]   Nurse Notes: none

## 2022-05-28 ENCOUNTER — Encounter: Payer: Self-pay | Admitting: Family Medicine

## 2022-05-28 ENCOUNTER — Ambulatory Visit (INDEPENDENT_AMBULATORY_CARE_PROVIDER_SITE_OTHER): Payer: Medicare Other | Admitting: Family Medicine

## 2022-05-28 VITALS — BP 122/78 | HR 78 | Temp 97.9°F | Wt 174.7 lb

## 2022-05-28 DIAGNOSIS — I10 Essential (primary) hypertension: Secondary | ICD-10-CM

## 2022-05-28 DIAGNOSIS — E782 Mixed hyperlipidemia: Secondary | ICD-10-CM | POA: Diagnosis not present

## 2022-05-28 DIAGNOSIS — R8281 Pyuria: Secondary | ICD-10-CM

## 2022-05-28 DIAGNOSIS — R3911 Hesitancy of micturition: Secondary | ICD-10-CM | POA: Diagnosis not present

## 2022-05-28 MED ORDER — LOSARTAN POTASSIUM 50 MG PO TABS: 50.0000 mg | ORAL_TABLET | Freq: Every day | ORAL | 1 refills | Status: DC

## 2022-05-28 MED ORDER — MELOXICAM 15 MG PO TABS: 15.0000 mg | ORAL_TABLET | Freq: Every day | ORAL | 3 refills | Status: DC

## 2022-05-28 NOTE — Assessment & Plan Note (Signed)
Under good control on current regimen. Continue current regimen. Continue to monitor. Call with any concerns. Refills given. Labs drawn today.   

## 2022-05-28 NOTE — Progress Notes (Signed)
BP 122/78   Pulse 78   Temp 97.9 F (36.6 C) (Oral)   Wt 174 lb 11.2 oz (79.2 kg)   SpO2 98%   BMI 27.36 kg/m    Subjective:    Patient ID: Bryan West, male    DOB: May 13, 1960, 62 y.o.   MRN: [id number]  HPI: Bryan West is a 62 y.o. male  Chief Complaint  Patient presents with   Hypertension        Hyperlipidemia   HYPERTENSION / [name] with current treatment? yes Duration of hypertension: chronic BP monitoring frequency: not checking BP medication side effects: no Past BP meds: losartan Duration of hyperlipidemia: chronic Cholesterol medication side effects: N/A Cholesterol supplements: none Past cholesterol medications: none Medication compliance: excellent compliance Aspirin: no Recent stressors: no Recurrent headaches: no Visual changes: no Palpitations: no Dyspnea: no Chest pain: no Lower extremity edema: no Dizzy/lightheaded: no  Relevant past medical, surgical, family and social history reviewed and updated as indicated. Interim medical history since our last visit reviewed. Allergies and medications reviewed and updated.  Review of Systems  Constitutional: Negative.   HENT: Negative.    Eyes: Negative.   Respiratory: Negative.    Cardiovascular: Negative.   Gastrointestinal: Negative.   Endocrine: Negative.   Genitourinary: Negative.   Musculoskeletal: Negative.   Allergic/Immunologic: Negative.   Neurological: Negative.   Psychiatric/Behavioral: Negative.      Per HPI unless specifically indicated above     Objective:    BP 122/78   Pulse 78   Temp 97.9 F (36.6 C) (Oral)   Wt 174 lb 11.2 oz (79.2 kg)   SpO2 98%   BMI 27.36 kg/m   Wt Readings from Last 3 Encounters:  05/28/22 174 lb 11.2 oz (79.2 kg)  05/19/22 171 lb (77.6 kg)  12/04/21 171 lb 12.8 oz (77.9 kg)    Physical Exam Vitals and nursing note reviewed.  Constitutional:      General: He is not in acute distress.    Appearance: Normal appearance.  He is normal weight. He is not ill-appearing, toxic-appearing or diaphoretic.  HENT:     Head: Normocephalic and atraumatic.     Right Ear: Tympanic membrane, ear canal and external ear normal. There is no impacted cerumen.     Left Ear: Tympanic membrane, ear canal and external ear normal. There is no impacted cerumen.     Nose: Nose normal. No congestion or rhinorrhea.     Mouth/Throat:     Mouth: Mucous membranes are moist.     Pharynx: Oropharynx is clear. No oropharyngeal exudate or posterior oropharyngeal erythema.  Eyes:     General: No scleral icterus.       Right eye: No discharge.        Left eye: No discharge.     Extraocular Movements: Extraocular movements intact.     Conjunctiva/sclera: Conjunctivae normal.     Pupils: Pupils are equal, round, and reactive to light.  Neck:     Vascular: No carotid bruit.  Cardiovascular:     Rate and Rhythm: Normal rate and regular rhythm.     Pulses: Normal pulses.     Heart sounds: No murmur heard.    No friction rub. No gallop.  Pulmonary:     Effort: Pulmonary effort is normal. No respiratory distress.     Breath sounds: Normal breath sounds. No stridor. No wheezing, rhonchi or rales.  Chest:     Chest wall: No tenderness.  Abdominal:  General: Abdomen is flat. Bowel sounds are normal. There is no distension.     Palpations: Abdomen is soft. There is no mass.     Tenderness: There is no abdominal tenderness. There is no right CVA tenderness, left CVA tenderness, guarding or rebound.     Hernia: No hernia is present.  Genitourinary:    Comments: Genital exam deferred with shared decision making Musculoskeletal:        General: No swelling, tenderness, deformity or signs of injury.     Cervical back: Normal range of motion and neck supple. No rigidity. No muscular tenderness.     Right lower leg: No edema.     Left lower leg: No edema.  Lymphadenopathy:     Cervical: No cervical adenopathy.  Skin:    General: Skin is warm  and dry.     Capillary Refill: Capillary refill takes less than 2 seconds.     Coloration: Skin is not jaundiced or pale.     Findings: No bruising, erythema, lesion or rash.  Neurological:     General: No focal deficit present.     Mental Status: He is alert and oriented to person, place, and time.     Cranial Nerves: No cranial nerve deficit.     Sensory: No sensory deficit.     Motor: No weakness.     Coordination: Coordination normal.     Gait: Gait normal.     Deep Tendon Reflexes: Reflexes normal.  Psychiatric:        Mood and Affect: Mood normal.        Behavior: Behavior normal.        Thought Content: Thought content normal.        Judgment: Judgment normal.     Results for orders placed or performed in visit on 12/04/21  Comprehensive metabolic panel  Result Value Ref Range   Glucose 156 (H) 70 - 99 mg/dL   BUN 13 8 - 27 mg/dL   Creatinine, Ser 0.91 0.76 - 1.27 mg/dL   eGFR 95 >59 mL/min/1.73   BUN/Creatinine Ratio 14 10 - 24   Sodium 139 134 - 144 mmol/L   Potassium 3.9 3.5 - 5.2 mmol/L   Chloride 101 96 - 106 mmol/L   CO2 26 20 - 29 mmol/L   Calcium 9.0 8.6 - 10.2 mg/dL   Total Protein 6.9 6.0 - 8.5 g/dL   Albumin 3.2 (L) 3.9 - 4.9 g/dL   Globulin, Total 3.7 1.5 - 4.5 g/dL   Albumin/Globulin Ratio 0.9 (L) 1.2 - 2.2   Bilirubin Total 0.3 0.0 - 1.2 mg/dL   Alkaline Phosphatase 104 44 - 121 IU/L   AST 9 0 - 40 IU/L   ALT 15 0 - 44 IU/L  CBC with Differential/Platelet  Result Value Ref Range   WBC 6.4 3.4 - 10.8 x10E3/uL   RBC 4.21 4.14 - 5.80 x10E6/uL   Hemoglobin 12.6 (L) 13.0 - 17.7 g/dL   Hematocrit 37.8 37.5 - 51.0 %   MCV 90 79 - 97 fL   MCH 29.9 26.6 - 33.0 pg   MCHC 33.3 31.5 - 35.7 g/dL   RDW 13.1 11.6 - 15.4 %   Platelets 203 150 - 450 x10E3/uL   Neutrophils 47 Not Estab. %   Lymphs 40 Not Estab. %   Monocytes 8 Not Estab. %   Eos 3 Not Estab. %   Basos 1 Not Estab. %   Neutrophils Absolute 3.1 1.4 - 7.0 x10E3/uL  Lymphocytes Absolute 2.6  0.7 - 3.1 x10E3/uL   Monocytes Absolute 0.5 0.1 - 0.9 x10E3/uL   EOS (ABSOLUTE) 0.2 0.0 - 0.4 x10E3/uL   Basophils Absolute 0.0 0.0 - 0.2 x10E3/uL   Immature Granulocytes 1 Not Estab. %   Immature Grans (Abs) 0.0 0.0 - 0.1 x10E3/uL  TSH  Result Value Ref Range   TSH 1.530 0.450 - 4.500 uIU/mL      Assessment & Plan:   Problem List Items Addressed This Visit       Cardiovascular and Mediastinum   Hypertension    Under good control on current regimen. Continue current regimen. Continue to monitor. Call with any concerns. Refills given. Labs drawn today.        Relevant Medications   losartan (COZAAR) 50 MG tablet   Other Relevant Orders   Comprehensive metabolic panel   CBC with Differential/Platelet   TSH   Urinalysis, Routine w reflex microscopic   Urine Microalbumin w/creat. ratio     Other   Hyperlipidemia - Primary    Doing well. Rechecking labs today. Await results. Treat as needed.       Relevant Medications   losartan (COZAAR) 50 MG tablet   Other Relevant Orders   Comprehensive metabolic panel   CBC with Differential/Platelet   Lipid Panel w/o Chol/HDL Ratio   Other Visit Diagnoses     Hesitancy       Will check labs today. Await results. Treat as needed.   Relevant Orders   PSA        Follow up plan: Return in about 6 months (around 11/26/2022).

## 2022-05-28 NOTE — Addendum Note (Signed)
Addended by: Valerie Roys on: 05/28/2022 02:16 PM   Modules accepted: Orders

## 2022-05-28 NOTE — Assessment & Plan Note (Signed)
Doing well. Rechecking labs today. Await results. Treat as needed.

## 2022-05-29 ENCOUNTER — Other Ambulatory Visit: Payer: Self-pay | Admitting: Nurse Practitioner

## 2022-05-29 LAB — COMPREHENSIVE METABOLIC PANEL
ALT: 14 IU/L (ref 0–44)
AST: 6 IU/L (ref 0–40)
Albumin/Globulin Ratio: 1.2 (ref 1.2–2.2)
Albumin: 4.2 g/dL (ref 3.9–4.9)
Alkaline Phosphatase: 106 IU/L (ref 44–121)
BUN/Creatinine Ratio: 12 (ref 10–24)
BUN: 11 mg/dL (ref 8–27)
Bilirubin Total: 0.9 mg/dL (ref 0.0–1.2)
CO2: 25 mmol/L (ref 20–29)
Calcium: 9.5 mg/dL (ref 8.6–10.2)
Chloride: 99 mmol/L (ref 96–106)
Creatinine, Ser: 0.89 mg/dL (ref 0.76–1.27)
Globulin, Total: 3.5 g/dL (ref 1.5–4.5)
Glucose: 95 mg/dL (ref 70–99)
Potassium: 4.1 mmol/L (ref 3.5–5.2)
Sodium: 138 mmol/L (ref 134–144)
Total Protein: 7.7 g/dL (ref 6.0–8.5)
eGFR: 97 mL/min/{1.73_m2} (ref >59)

## 2022-05-29 LAB — LIPID PANEL W/O CHOL/HDL RATIO
Cholesterol, Total: 214 mg/dL — ABNORMAL HIGH (ref 100–199)
HDL: 47 mg/dL (ref >39)
LDL Chol Calc (NIH): 125 mg/dL — ABNORMAL HIGH (ref 0–99)
Triglycerides: 239 mg/dL — ABNORMAL HIGH (ref 0–149)
VLDL Cholesterol Cal: 42 mg/dL — ABNORMAL HIGH (ref 5–40)

## 2022-05-29 LAB — CBC WITH DIFFERENTIAL/PLATELET
Basophils Absolute: 0 10*3/uL (ref 0.0–0.2)
Basos: 1 %
EOS (ABSOLUTE): 0.2 10*3/uL (ref 0.0–0.4)
Eos: 3 %
Hematocrit: 42.8 % (ref 37.5–51.0)
Hemoglobin: 14.9 g/dL (ref 13.0–17.7)
Immature Grans (Abs): 0 10*3/uL (ref 0.0–0.1)
Immature Granulocytes: 0 %
Lymphocytes Absolute: 2.9 10*3/uL (ref 0.7–3.1)
Lymphs: 43 %
MCH: 31.2 pg (ref 26.6–33.0)
MCHC: 34.8 g/dL (ref 31.5–35.7)
MCV: 90 fL (ref 79–97)
Monocytes Absolute: 0.4 10*3/uL (ref 0.1–0.9)
Monocytes: 6 %
Neutrophils Absolute: 3.1 10*3/uL (ref 1.4–7.0)
Neutrophils: 47 %
Platelets: 190 10*3/uL (ref 150–450)
RBC: 4.78 x10E6/uL (ref 4.14–5.80)
RDW: 13.2 % (ref 11.6–15.4)
WBC: 6.7 10*3/uL (ref 3.4–10.8)

## 2022-05-29 LAB — TSH: TSH: 1.76 u[IU]/mL (ref 0.450–4.500)

## 2022-05-29 LAB — PSA: Prostate Specific Ag, Serum: 2.2 ng/mL (ref 0.0–4.0)

## 2022-05-29 MED ORDER — SULFAMETHOXAZOLE-TRIMETHOPRIM 800-160 MG PO TABS: 1.0000 | ORAL_TABLET | Freq: Two times a day (BID) | ORAL | 0 refills | Status: AC

## 2022-05-29 NOTE — Progress Notes (Signed)
Good morning, please let Bryan West know Dr. Wynetta Emery will review rest of labs when she returns, but his urine returned showing an infection is probably present.  We are waiting on culture, but since it appears he was having symptoms I have sent in Bactrim to start for this for 7 days, we may switch this dependent on urine culture results.  Any questions?

## 2022-05-30 LAB — MICROALBUMIN / CREATININE URINE RATIO
Creatinine, Urine: 42.5 mg/dL
Microalb/Creat Ratio: 12 mg/g creat (ref 0–29)
Microalbumin, Urine: 5.3 ug/mL

## 2022-05-30 LAB — URINALYSIS, ROUTINE W REFLEX MICROSCOPIC
Bilirubin, UA: NEGATIVE
Glucose, UA: NEGATIVE
Ketones, UA: NEGATIVE
Nitrite, UA: POSITIVE — AB
Protein,UA: NEGATIVE
RBC, UA: NEGATIVE
Specific Gravity, UA: 1.008 (ref 1.005–1.030)
Urobilinogen, Ur: 0.2 mg/dL (ref 0.2–1.0)
pH, UA: 5.5 (ref 5.0–7.5)

## 2022-05-30 LAB — MICROSCOPIC EXAMINATION
Casts: NONE SEEN /lpf
RBC, Urine: NONE SEEN /hpf (ref 0–2)
WBC, UA: >30 /hpf — AB (ref 0–5)

## 2022-06-01 ENCOUNTER — Telehealth: Payer: Self-pay

## 2022-06-01 NOTE — Telephone Encounter (Signed)
Spoke with patient's pharmacy and [place] [place] Pharmacist informed me that prescription has been ready for pick up since Saturday. Zenia Resides was notified and verbalized understanding.

## 2022-06-01 NOTE — Telephone Encounter (Signed)
Copied from [place] [id number]. Topic: General - Other >> Jun 01, 2022 10:48 AM Cyndi Bender wrote: Reason for CRM: Pt brother Dynell Diskin stated he received a call about pt lab results and to inform him that a Rx would be sent to the pharmacy. Zenia Resides sted he contacted the pharmacy but no Rx had been received. Allen requests return call at [phone]

## 2022-06-03 LAB — URINE CULTURE

## 2022-07-01 ENCOUNTER — Ambulatory Visit: Payer: Medicare Other | Admitting: Dermatology

## 2022-09-03 ENCOUNTER — Ambulatory Visit (INDEPENDENT_AMBULATORY_CARE_PROVIDER_SITE_OTHER): Payer: Medicare Other | Admitting: Dermatology

## 2022-09-03 VITALS — BP 140/68 | HR 68

## 2022-09-03 DIAGNOSIS — L570 Actinic keratosis: Secondary | ICD-10-CM

## 2022-09-03 DIAGNOSIS — L578 Other skin changes due to chronic exposure to nonionizing radiation: Secondary | ICD-10-CM | POA: Diagnosis not present

## 2022-09-03 DIAGNOSIS — L820 Inflamed seborrheic keratosis: Secondary | ICD-10-CM

## 2022-09-03 DIAGNOSIS — W908XXA Exposure to other nonionizing radiation, initial encounter: Secondary | ICD-10-CM

## 2022-09-03 DIAGNOSIS — X32XXXA Exposure to sunlight, initial encounter: Secondary | ICD-10-CM | POA: Diagnosis not present

## 2022-09-03 DIAGNOSIS — L409 Psoriasis, unspecified: Secondary | ICD-10-CM

## 2022-09-03 DIAGNOSIS — Z7189 Other specified counseling: Secondary | ICD-10-CM

## 2022-09-03 DIAGNOSIS — Z79899 Other long term (current) drug therapy: Secondary | ICD-10-CM

## 2022-09-03 MED ORDER — MOMETASONE FUROATE 0.1 % EX CREA
TOPICAL_CREAM | CUTANEOUS | 6 refills | Status: DC
Start: 2022-09-03 — End: 2023-10-18

## 2022-09-03 MED ORDER — MOMETASONE FUROATE 0.1 % EX SOLN
CUTANEOUS | 6 refills | Status: DC
Start: 2022-09-03 — End: 2023-09-02

## 2022-09-03 MED ORDER — KETOCONAZOLE 2 % EX SHAM
1.0000 | MEDICATED_SHAMPOO | CUTANEOUS | 11 refills | Status: AC
Start: 2022-09-04 — End: ?

## 2022-09-03 NOTE — Patient Instructions (Addendum)
For Psoriasis at sacral area and scalp  Continue vtama sample use daily as needed for scale and rash   Continue ketoconazole shampoo 3 x times weekly apply to scalp let sit for 5 mins before rinsing  Continue mometasone cream and lotion as directed new refills sent to pharmacy       Actinic keratoses are precancerous spots that appear secondary to cumulative UV radiation exposure/sun exposure over time. They are chronic with expected duration over 1 year. A portion of actinic keratoses will progress to squamous cell carcinoma of the skin. It is not possible to reliably predict which spots will progress to skin cancer and so treatment is recommended to prevent development of skin cancer.  Recommend daily broad spectrum sunscreen SPF 30+ to sun-exposed areas, reapply every 2 hours as needed.  Recommend staying in the shade or wearing long sleeves, sun glasses (UVA+UVB protection) and wide brim hats (4-inch brim around the entire circumference of the hat). Call for new or changing lesions.   Cryotherapy Aftercare  Wash gently with soap and water everyday.   Apply Vaseline and Band-Aid daily until healed.         Due to recent changes in healthcare laws, you may see results of your pathology and/or laboratory studies on MyChart before the doctors have had a chance to review them. We understand that in some cases there may be results that are confusing or concerning to you. Please understand that not all results are received at the same time and often the doctors may need to interpret multiple results in order to provide you with the best plan of care or course of treatment. Therefore, we ask that you please give [place] 2 business days to thoroughly review all your results before contacting the office for clarification. Should we see a critical lab result, you will be contacted sooner.   If You Need Anything After Your Visit  If you have any questions or concerns for your doctor, please call  our main line at [phone] and press option 4 to reach your doctor's medical assistant. If no one answers, please leave a voicemail as directed and we will return your call as soon as possible. Messages left after 4 pm will be answered the following business day.   You may also send [place] a message via MyChart. We typically respond to MyChart messages within 1-2 business days.  For prescription refills, please ask your pharmacy to contact our office. Our fax number is [phone].  If you have an urgent issue when the clinic is closed that cannot wait until the next business day, you can page your doctor at the number below.    Please note that while we do our best to be available for urgent issues outside of office hours, we are not available 24/7.   If you have an urgent issue and are unable to reach [place], you may choose to seek medical care at your doctor's office, retail clinic, urgent care center, or emergency room.  If you have a medical emergency, please immediately call 911 or go to the emergency department.  Pager Numbers  - Dr. Gwen Pounds: [phone]  - Dr. Neale Burly: [phone]  - Dr. Roseanne Reno: [phone]  In the event of inclement weather, please call our main line at [phone] for an update on the status of any delays or closures.  Dermatology Medication Tips: Please keep the boxes that topical medications come in in order to help keep track of the instructions about where and how  to use these. Pharmacies typically print the medication instructions only on the boxes and not directly on the medication tubes.   If your medication is too expensive, please contact our office at [phone] option 4 or send [place] a message through MyChart.   We are unable to tell what your co-pay for medications will be in advance as this is different depending on your insurance coverage. However, we may be able to find a substitute medication at lower cost or fill out paperwork to get insurance to  cover a needed medication.   If a prior authorization is required to get your medication covered by your insurance company, please allow [place] 1-2 business days to complete this process.  Drug prices often vary depending on where the prescription is filled and some pharmacies may offer cheaper prices.  The website www.goodrx.com contains coupons for medications through different pharmacies. The prices here do not account for what the cost may be with help from insurance (it may be cheaper with your insurance), but the website can give you the price if you did not use any insurance.  - You can print the associated coupon and take it with your prescription to the pharmacy.  - You may also stop by our office during regular business hours and pick up a GoodRx coupon card.  - If you need your prescription sent electronically to a different pharmacy, notify our office through [facility] or by phone at [phone] option 4.     Si Usted Necesita Algo Despus de Su Visita  Tambin puede enviarnos un mensaje a travs de Clinical cytogeneticist. Por lo general respondemos a los mensajes de MyChart en el transcurso de 1 a 2 das hbiles.  Para renovar recetas, por favor pida a su farmacia que se ponga en contacto con nuestra oficina. Annie Sable de fax es [place] [phone].  Si tiene un asunto urgente cuando la clnica est cerrada y que no puede esperar hasta el siguiente da hbil, puede llamar/localizar a su doctor(a) al nmero que aparece a continuacin.   Por favor, tenga en cuenta que aunque hacemos todo lo posible para estar disponibles para asuntos urgentes fuera del horario de [place], no estamos disponibles las 24 horas del da, los 7 [address]  [address] de la [place].   Si tiene un problema urgente y no puede comunicarse con nosotros, puede optar por buscar atencin mdica  en el consultorio de su doctor(a), en una clnica privada, en un centro de atencin urgente o en una sala de emergencias.  Si tiene Wellsite geologist, por favor llame inmediatamente al 911 o vaya a la sala de emergencias.  Nmeros de bper  - Dr. Gwen Pounds: [phone]  - Dra. Moye: [phone]  - Dra. Roseanne Reno: [phone]  En caso de inclemencias del [place], por favor llame a Lacy Duverney principal al [phone] para una actualizacin sobre el [place] de cualquier retraso o cierre.  Consejos para la medicacin en dermatologa: Por favor, guarde las cajas en las que vienen los medicamentos de uso tpico para ayudarle a seguir las instrucciones sobre dnde y cmo usarlos. Las farmacias generalmente imprimen las instrucciones del medicamento slo en las cajas y no directamente en los tubos del [place].   Si su medicamento es muy caro, por favor, pngase en contacto con Rolm Gala llamando al [phone] y presione la opcin 4 o envenos un mensaje a travs de Clinical cytogeneticist.   No podemos decirle cul ser su copago por los medicamentos por adelantado ya que esto es diferente dependiendo  de la cobertura de su seguro. Sin embargo, es posible que podamos encontrar un medicamento sustituto a Audiological scientist un formulario para que el seguro cubra el medicamento que se considera necesario.   Si se requiere una autorizacin previa para que su compaa de seguros [place] su medicamento, por favor permtanos de 1 a 2 das hbiles para completar [address]  Los precios de los medicamentos varan con frecuencia dependiendo del Environmental consultant de dnde se surte la receta y alguna farmacias pueden ofrecer precios ms baratos.  El sitio web www.goodrx.com tiene cupones para medicamentos de Health and safety inspector. Los precios aqu no tienen en cuenta lo que podra costar con la ayuda del seguro (puede ser ms barato con su seguro), pero el sitio web puede darle el precio si no utiliz Tourist information centre manager.  - Puede imprimir el cupn correspondiente y llevarlo con su receta a la farmacia.  - Tambin puede pasar por nuestra oficina durante el  horario de atencin regular y Education officer, museum una tarjeta de cupones de GoodRx.  - Si necesita que su receta se enve electrnicamente a una farmacia diferente, informe a nuestra oficina a travs de MyChart de [place] o por telfono llamando al [phone] y presione la opcin 4.

## 2022-09-03 NOTE — Progress Notes (Signed)
Follow-Up Visit   Subjective  Bryan West is a 62 y.o. male who presents for the following: 6 month f/u on aks and psoriasis. Patient here today with sister. Reports patient is doing well on current treatments for psoriasis  The patient has spots, moles and lesions to be evaluated, some may be new or changing and the patient may have concern these could be cancer.  The following portions of the chart were reviewed this encounter and updated as appropriate: medications, allergies, medical history  Review of Systems:  No other skin or systemic complaints except as noted in HPI or Assessment and Plan.  Objective  Well appearing patient in no apparent distress; mood and affect are within normal limits. A focused examination was performed of the following areas: Scalp, ears, sacral area, face, b/l arms and hands  Relevant exam findings are noted in the Assessment and Plan.  hands face and ears x 16 (16) Erythematous thin papules/macules with gritty scale.   right forehead x 1 Erythematous stuck-on, waxy papule or plaque   Assessment & Plan   Psoriasis Sacral area, scalp Minimal scale at scalp and sacral area at clear today  Chronic and persistent condition with duration or expected duration over one year. Condition is symptomatic / bothersome to patient. Not to goal, but much improved and less itchy than before and much thinner.   Psoriasis is a chronic non-curable, but treatable genetic/hereditary disease that may have other systemic features affecting other organ systems such as joints (Psoriatic Arthritis). It is associated with an increased risk of inflammatory bowel disease, heart disease, non-alcoholic fatty liver disease, and depression.     Continue Vtama samples given to use qd to sacral area.  Continue Ketoconazole shampoo - as directed 3 times weekly. Let sit 5 mins before washing out.  Continue mometasone cream and lotion as needed for itch and rash.   Topical  steroids (such as triamcinolone, fluocinolone, fluocinonide, mometasone, clobetasol, halobetasol, betamethasone, hydrocortisone) can cause thinning and lightening of the skin if they are used for too long in the same area. Your physician has selected the right strength medicine for your problem and area affected on the body. Please use your medication only as directed by your physician to prevent side effects.   ACTINIC DAMAGE - chronic, secondary to cumulative UV radiation exposure/sun exposure over time - diffuse scaly erythematous macules with underlying dyspigmentation - Recommend daily broad spectrum sunscreen SPF 30+ to sun-exposed areas, reapply every 2 hours as needed.  - Recommend staying in the shade or wearing long sleeves, sun glasses (UVA+UVB protection) and wide brim hats (4-inch brim around the entire circumference of the hat). - Call for new or changing lesions.  Actinic keratosis (16) hands face and ears x 16  Actinic keratoses are precancerous spots that appear secondary to cumulative UV radiation exposure/sun exposure over time. They are chronic with expected duration over 1 year. A portion of actinic keratoses will progress to squamous cell carcinoma of the skin. It is not possible to reliably predict which spots will progress to skin cancer and so treatment is recommended to prevent development of skin cancer.  Recommend daily broad spectrum sunscreen SPF 30+ to sun-exposed areas, reapply every 2 hours as needed.  Recommend staying in the shade or wearing long sleeves, sun glasses (UVA+UVB protection) and wide brim hats (4-inch brim around the entire circumference of the hat). Call for new or changing lesions.  Destruction of lesion - hands face and ears x 16 Complexity:  simple   Destruction method: cryotherapy   Informed consent: discussed and consent obtained   Timeout:  patient name, date of birth, surgical site, and procedure verified Lesion destroyed using liquid  nitrogen: Yes   Region frozen until ice ball extended beyond lesion: Yes   Outcome: patient tolerated procedure well with no complications   Post-procedure details: wound care instructions given    Psoriasis  Related Medications ketoconazole (NIZORAL) 2 % shampoo Apply 1 Application topically 3 (three) times a week. Wash scalp 3 times weekly, let sit 5 minutes before rinsing out  mometasone (ELOCON) 0.1 % lotion Daily up to 5 days per week prn flares  mometasone (ELOCON) 0.1 % cream Apply ONCE A DAY topically up to 5 days a week to psoriasis on buttocks or body as needed for flares  Inflamed seborrheic keratosis right forehead x 1  Symptomatic, irritating, patient would like treated.  Destruction of lesion - right forehead x 1 Complexity: simple   Destruction method: cryotherapy   Informed consent: discussed and consent obtained   Timeout:  patient name, date of birth, surgical site, and procedure verified Lesion destroyed using liquid nitrogen: Yes   Region frozen until ice ball extended beyond lesion: Yes   Outcome: patient tolerated procedure well with no complications   Post-procedure details: wound care instructions given     Return in about 6 months (around 03/06/2023) for ak and psoriasis follow up.  IAsher Muir, CMA, am acting as scribe for Armida Sans, MD.  Documentation: I have reviewed the above documentation for accuracy and completeness, and I agree with the above.  Armida Sans, MD

## 2022-09-16 ENCOUNTER — Encounter: Payer: Self-pay | Admitting: Dermatology

## 2022-09-24 DIAGNOSIS — M79674 Pain in right toe(s): Secondary | ICD-10-CM | POA: Diagnosis not present

## 2022-09-24 DIAGNOSIS — B351 Tinea unguium: Secondary | ICD-10-CM | POA: Diagnosis not present

## 2022-09-24 DIAGNOSIS — L600 Ingrowing nail: Secondary | ICD-10-CM | POA: Diagnosis not present

## 2022-09-24 DIAGNOSIS — M79675 Pain in left toe(s): Secondary | ICD-10-CM | POA: Diagnosis not present

## 2022-10-05 DIAGNOSIS — H6063 Unspecified chronic otitis externa, bilateral: Secondary | ICD-10-CM | POA: Diagnosis not present

## 2022-10-05 DIAGNOSIS — H6123 Impacted cerumen, bilateral: Secondary | ICD-10-CM | POA: Diagnosis not present

## 2022-10-21 ENCOUNTER — Ambulatory Visit (INDEPENDENT_AMBULATORY_CARE_PROVIDER_SITE_OTHER): Payer: Medicare Other | Admitting: Dermatology

## 2022-10-21 VITALS — BP 136/64

## 2022-10-21 DIAGNOSIS — L570 Actinic keratosis: Secondary | ICD-10-CM

## 2022-10-21 DIAGNOSIS — Z7189 Other specified counseling: Secondary | ICD-10-CM | POA: Diagnosis not present

## 2022-10-21 DIAGNOSIS — L578 Other skin changes due to chronic exposure to nonionizing radiation: Secondary | ICD-10-CM

## 2022-10-21 DIAGNOSIS — W908XXA Exposure to other nonionizing radiation, initial encounter: Secondary | ICD-10-CM

## 2022-10-21 DIAGNOSIS — Z79899 Other long term (current) drug therapy: Secondary | ICD-10-CM | POA: Diagnosis not present

## 2022-10-21 DIAGNOSIS — L409 Psoriasis, unspecified: Secondary | ICD-10-CM

## 2022-10-21 MED ORDER — VTAMA 1 % EX CREA: 1.0000 | TOPICAL_CREAM | Freq: Every day | CUTANEOUS | 6 refills | Status: DC

## 2022-10-21 MED ORDER — ZORYVE 0.3 % EX CREA: 1.0000 | TOPICAL_CREAM | Freq: Every day | CUTANEOUS | 6 refills | Status: DC

## 2022-10-21 NOTE — Progress Notes (Signed)
Follow-Up Visit   Subjective  Bryan West is a 62 y.o. male who presents for the following: Psoriasis, buttocks, Vtama qd not clearing The patient has spots, moles and lesions to be evaluated, some may be new or changing and the patient may have concern these could be cancer.  Patient accompanied by brother who contributes to history.  The following portions of the chart were reviewed this encounter and updated as appropriate: medications, allergies, medical history  Review of Systems:  No other skin or systemic complaints except as noted in HPI or Assessment and Plan.  Objective  Well appearing patient in no apparent distress; mood and affect are within normal limits.  Areas Examined: Buttocks, face, scalp  Relevant exam findings are noted in the Assessment and Plan.  face x 6 (6) Pink scaly macules   Assessment & Plan   AK (actinic keratosis) (6) face x 6  Destruction of lesion - face x 6 (6) Complexity: simple   Destruction method: cryotherapy   Informed consent: discussed and consent obtained   Timeout:  patient name, date of birth, surgical site, and procedure verified Lesion destroyed using liquid nitrogen: Yes   Region frozen until ice ball extended beyond lesion: Yes   Outcome: patient tolerated procedure well with no complications   Post-procedure details: wound care instructions given    Psoriasis  Related Medications ketoconazole (NIZORAL) 2 % shampoo Apply 1 Application topically 3 (three) times a week. Wash scalp 3 times weekly, let sit 5 minutes before rinsing out  mometasone (ELOCON) 0.1 % lotion Daily up to 5 days per week prn flares  mometasone (ELOCON) 0.1 % cream Apply ONCE A DAY topically up to 5 days a week to psoriasis on buttocks or body as needed for flares  Medication management  Counseling and coordination of care  Actinic skin damage  ACTINIC DAMAGE - chronic, secondary to cumulative UV radiation exposure/sun exposure over  time - diffuse scaly erythematous macules with underlying dyspigmentation - Recommend daily broad spectrum sunscreen SPF 30+ to sun-exposed areas, reapply every 2 hours as needed.  - Recommend staying in the shade or wearing long sleeves, sun glasses (UVA+UVB protection) and wide brim hats (4-inch brim around the entire circumference of the hat). - Call for new or changing lesions.  PSORIASIS Buttocks, scalp Exam: thin pink plaques covering entire buttocks, scalp clear 6% BSA. Chronic and persistent condition with duration or expected duration over one year. Condition is bothersome/symptomatic for patient. Currently flared, but overall, still better than in past. Treatment Plan: Cont Vtama qd to aa psoriasis on buttocks until clear, then prn flares Start Zoryve cr qd to active areas of psoriasis on buttocks, scalp until clear, then prn flares, samples x 4, Lot WBBD exp 03/25 Long term medication management.  Patient is using long term (months to years) prescription medication  to control their dermatologic condition.  These medications require periodic monitoring to evaluate for efficacy and side effects and may require periodic laboratory monitoring.  Counseling on psoriasis and coordination of care  psoriasis is a chronic non-curable, but treatable genetic/hereditary disease that may have other systemic features affecting other organ systems such as joints (Psoriatic Arthritis). It is associated with an increased risk of inflammatory bowel disease, heart disease, non-alcoholic fatty liver disease, and depression.  Treatments include light and laser treatments; topical medications; and systemic medications including oral and injectables.  Return for as scheduled for AK and psoriasis f/u.  I, Ardis Rowan, RMA, am acting as scribe for  Armida Sans, MD .  Documentation: I have reviewed the above documentation for accuracy and completeness, and I agree with the above.  Armida Sans, MD

## 2022-10-21 NOTE — Patient Instructions (Addendum)
Continue Vtama once daily in the morning to active areas of psoriasis on buttocks, scalp until clear, then as needed for  flares Start Zoryve cream once daily at night to active areas of psoriasis on buttocks, scalp until clear, then as needed for flares   Cryotherapy Aftercare  Wash gently with soap and water everyday.   Apply Vaseline and Band-Aid daily until healed.    Due to recent changes in healthcare laws, you may see results of your pathology and/or laboratory studies on MyChart before the doctors have had a chance to review them. We understand that in some cases there may be results that are confusing or concerning to you. Please understand that not all results are received at the same time and often the doctors may need to interpret multiple results in order to provide you with the best plan of care or course of treatment. Therefore, we ask that you please give [place] 2 business days to thoroughly review all your results before contacting the office for clarification. Should we see a critical lab result, you will be contacted sooner.   If You Need Anything After Your Visit  If you have any questions or concerns for your doctor, please call our main line at [phone] and press option 4 to reach your doctor's medical assistant. If no one answers, please leave a voicemail as directed and we will return your call as soon as possible. Messages left after 4 pm will be answered the following business day.   You may also send [place] a message via MyChart. We typically respond to MyChart messages within 1-2 business days.  For prescription refills, please ask your pharmacy to contact our office. Our fax number is [phone].  If you have an urgent issue when the clinic is closed that cannot wait until the next business day, you can page your doctor at the number below.    Please note that while we do our best to be available for urgent issues outside of office hours, we are not available 24/7.    If you have an urgent issue and are unable to reach [place], you may choose to seek medical care at your doctor's office, retail clinic, urgent care center, or emergency room.  If you have a medical emergency, please immediately call 911 or go to the emergency department.  Pager Numbers  - Dr. Gwen Pounds: [phone]  - Dr. Neale Burly: [phone]  - Dr. Roseanne Reno: [phone]  In the event of inclement weather, please call our main line at [phone] for an update on the status of any delays or closures.  Dermatology Medication Tips: Please keep the boxes that topical medications come in in order to help keep track of the instructions about where and how to use these. Pharmacies typically print the medication instructions only on the boxes and not directly on the medication tubes.   If your medication is too expensive, please contact our office at [phone] option 4 or send [place] a message through MyChart.   We are unable to tell what your co-pay for medications will be in advance as this is different depending on your insurance coverage. However, we may be able to find a substitute medication at lower cost or fill out paperwork to get insurance to cover a needed medication.   If a prior authorization is required to get your medication covered by your insurance company, please allow [place] 1-2 business days to complete this process.  Drug prices often vary depending on where the prescription is  filled and some pharmacies may offer cheaper prices.  The website www.goodrx.com contains coupons for medications through different pharmacies. The prices here do not account for what the cost may be with help from insurance (it may be cheaper with your insurance), but the website can give you the price if you did not use any insurance.  - You can print the associated coupon and take it with your prescription to the pharmacy.  - You may also stop by our office during regular business hours and pick up a  GoodRx coupon card.  - If you need your prescription sent electronically to a different pharmacy, notify our office through [facility] or by phone at [phone] option 4.     Si Usted Necesita Algo Despus de Su Visita  Tambin puede enviarnos un mensaje a travs de Clinical cytogeneticist. Por lo general respondemos a los mensajes de MyChart en el transcurso de 1 a 2 das hbiles.  Para renovar recetas, por favor pida a su farmacia que se ponga en contacto con nuestra oficina. Annie Sable de fax es [place] [phone].  Si tiene un asunto urgente cuando la clnica est cerrada y que no puede esperar hasta el siguiente da hbil, puede llamar/localizar a su doctor(a) al nmero que aparece a continuacin.   Por favor, tenga en cuenta que aunque hacemos todo lo posible para estar disponibles para asuntos urgentes fuera del horario de [place], no estamos disponibles las 24 horas del da, los 7 [address]  [address] de la [place].   Si tiene un problema urgente y no puede comunicarse con nosotros, puede optar por buscar atencin mdica  en el consultorio de su doctor(a), en una clnica privada, en un centro de atencin urgente o en una sala de emergencias.  Si tiene Engineer, drilling, por favor llame inmediatamente al 911 o vaya a la sala de emergencias.  Nmeros de bper  - Dr. Gwen Pounds: [phone]  - Dra. Moye: [phone]  - Dra. Roseanne Reno: [phone]  En caso de inclemencias del [place], por favor llame a Lacy Duverney principal al [phone] para una actualizacin sobre el [place] de cualquier retraso o cierre.  Consejos para la medicacin en dermatologa: Por favor, guarde las cajas en las que vienen los medicamentos de uso tpico para ayudarle a seguir las instrucciones sobre dnde y cmo usarlos. Las farmacias generalmente imprimen las instrucciones del medicamento slo en las cajas y no directamente en los tubos del [place].   Si su medicamento es muy caro, por favor, pngase en contacto con  Rolm Gala llamando al [phone] y presione la opcin 4 o envenos un mensaje a travs de Clinical cytogeneticist.   No podemos decirle cul ser su copago por los medicamentos por adelantado ya que esto es diferente dependiendo de la cobertura de su seguro. Sin embargo, es posible que podamos encontrar un medicamento sustituto a Audiological scientist un formulario para que el seguro cubra el medicamento que se considera necesario.   Si se requiere una autorizacin previa para que su compaa de seguros [place] su medicamento, por favor permtanos de 1 a 2 das hbiles para completar [address]  Los precios de los medicamentos varan con frecuencia dependiendo del Environmental consultant de dnde se surte la receta y alguna farmacias pueden ofrecer precios ms baratos.  El sitio web www.goodrx.com tiene cupones para medicamentos de Health and safety inspector. Los precios aqu no tienen en cuenta lo que podra costar con la ayuda del seguro (puede ser ms barato con su seguro), pero el sitio web puede darle  el precio si no Visual merchandiser.  - Puede imprimir el cupn correspondiente y llevarlo con su receta a la farmacia.  - Tambin puede pasar por nuestra oficina durante el horario de atencin regular y Education officer, museum una tarjeta de cupones de GoodRx.  - Si necesita que su receta se enve electrnicamente a una farmacia diferente, informe a nuestra oficina a travs de MyChart de [place] o por telfono llamando al [phone] y presione la opcin 4.

## 2022-10-23 ENCOUNTER — Encounter: Payer: Self-pay | Admitting: Dermatology

## 2022-11-26 ENCOUNTER — Ambulatory Visit: Payer: Medicare Other | Admitting: Family Medicine

## 2022-12-16 ENCOUNTER — Other Ambulatory Visit: Payer: Self-pay | Admitting: Family Medicine

## 2022-12-17 NOTE — Telephone Encounter (Signed)
Requested medication (s) are due for refill today: yes  Requested medication (s) are on the active medication list:yes  Last refill:  05/28/22 #90 1 RF  Future visit scheduled: yes  Notes to clinic:  overdue lab work    Requested Prescriptions  Pending Prescriptions Disp Refills   losartan (COZAAR) 50 MG tablet [Pharmacy Med Name: LOSARTAN POTASSIUM 50 MG TAB] 90 tablet 0    Sig: Take 1 tablet (50 mg total) by mouth daily.     Cardiovascular:  Angiotensin Receptor Blockers Failed - 12/16/2022  9:26 AM      Failed - Cr in normal range and within 180 days    Creatinine, Ser  Date Value Ref Range Status  05/28/2022 0.89 0.76 - 1.27 mg/dL Final         Failed - K in normal range and within 180 days    Potassium  Date Value Ref Range Status  05/28/2022 4.1 3.5 - 5.2 mmol/L Final         Failed - Valid encounter within last 6 months    Recent Outpatient Visits           6 months ago Mixed hyperlipidemia   [place] [facility] [place], [place] P, DO   1 year ago Chronic left shoulder pain   [place] [facility] [place], [place], DO   1 year ago Primary hypertension   [place] [facility] [place], [place] P, DO   1 year ago Chronic left shoulder pain   [place] [facility] [place], Megan P, DO   1 year ago Mixed hyperlipidemia   [place] [facility] Dorcas Carrow, DO       Future Appointments             In 3 weeks Dorcas Carrow, DO [place] [facility]   In 2 months Deirdre Evener, MD [place] [facility]            Passed - Patient is not pregnant      Passed - Last BP in normal range    BP Readings from Last 1 Encounters:  10/21/22 136/64

## 2023-01-07 ENCOUNTER — Ambulatory Visit (INDEPENDENT_AMBULATORY_CARE_PROVIDER_SITE_OTHER): Payer: Medicare Other | Admitting: Family Medicine

## 2023-01-07 ENCOUNTER — Encounter: Payer: Self-pay | Admitting: Family Medicine

## 2023-01-07 VITALS — BP 136/82 | HR 70 | Temp 97.9°F | Ht 67.0 in | Wt 174.8 lb

## 2023-01-07 DIAGNOSIS — E782 Mixed hyperlipidemia: Secondary | ICD-10-CM

## 2023-01-07 DIAGNOSIS — I10 Essential (primary) hypertension: Secondary | ICD-10-CM

## 2023-01-07 DIAGNOSIS — Z23 Encounter for immunization: Secondary | ICD-10-CM | POA: Diagnosis not present

## 2023-01-07 MED ORDER — LOSARTAN POTASSIUM 50 MG PO TABS: 50.0000 mg | ORAL_TABLET | Freq: Every day | ORAL | 1 refills | Status: DC

## 2023-01-07 NOTE — Assessment & Plan Note (Signed)
Under good control on current regimen. Continue current regimen. Continue to monitor. Call with any concerns. Refills given. Labs drawn today.   

## 2023-01-07 NOTE — Assessment & Plan Note (Signed)
Rechecking labs today. Await results. Treat as needed.  °

## 2023-01-07 NOTE — Progress Notes (Signed)
BP 136/82   Pulse 70   Temp 97.9 F (36.6 C) (Oral)   Ht 5\' 7"  (1.702 m)   Wt 174 lb 12.8 oz (79.3 kg)   SpO2 98%   BMI 27.38 kg/m    Subjective:    Patient ID: Bryan West, male    DOB: 1960-05-30, 62 y.o.   MRN: [id number]  HPI: Bryan West is a 62 y.o. male  Chief Complaint  Patient presents with   Hypertension   Hyperlipidemia   HYPERTENSION / HYPERLIPIDEMIA Satisfied with current treatment? yes Duration of hypertension: chronic BP monitoring frequency: not checking BP medication side effects: no Past BP meds: losartan Duration of hyperlipidemia: chronic Cholesterol medication side effects: no Cholesterol supplements: none Past cholesterol medications: none Medication compliance: excellent compliance Aspirin: no Recent stressors: no Recurrent headaches: no Visual changes: no Palpitations: no Dyspnea: no Chest pain: no Lower extremity edema: no Dizzy/lightheaded: no  Relevant past medical, surgical, family and social history reviewed and updated as indicated. Interim medical history since our last visit reviewed. Allergies and medications reviewed and updated.  Review of Systems  Constitutional: Negative.   Respiratory: Negative.    Cardiovascular: Negative.   Musculoskeletal: Negative.   Neurological: Negative.   Psychiatric/Behavioral: Negative.      Per HPI unless specifically indicated above     Objective:    BP 136/82   Pulse 70   Temp 97.9 F (36.6 C) (Oral)   Ht 5\' 7"  (1.702 m)   Wt 174 lb 12.8 oz (79.3 kg)   SpO2 98%   BMI 27.38 kg/m   Wt Readings from Last 3 Encounters:  01/07/23 174 lb 12.8 oz (79.3 kg)  05/28/22 174 lb 11.2 oz (79.2 kg)  05/19/22 171 lb (77.6 kg)    Physical Exam Vitals and nursing note reviewed.  Constitutional:      General: He is not in acute distress.    Appearance: Normal appearance. He is not ill-appearing, toxic-appearing or diaphoretic.  HENT:     Head: Normocephalic and atraumatic.     Right Ear:  External ear normal.     Left Ear: External ear normal.     Nose: Nose normal.     Mouth/Throat:     Mouth: Mucous membranes are moist.     Pharynx: Oropharynx is clear.  Eyes:     General: No scleral icterus.       Right eye: No discharge.        Left eye: No discharge.     Extraocular Movements: Extraocular movements intact.     Conjunctiva/sclera: Conjunctivae normal.     Pupils: Pupils are equal, round, and reactive to light.  Cardiovascular:     Rate and Rhythm: Normal rate and regular rhythm.     Pulses: Normal pulses.     Heart sounds: Normal heart sounds. No murmur heard.    No friction rub. No gallop.  Pulmonary:     Effort: Pulmonary effort is normal. No respiratory distress.     Breath sounds: Normal breath sounds. No stridor. No wheezing, rhonchi or rales.  Chest:     Chest wall: No tenderness.  Musculoskeletal:        General: Normal range of motion.     Cervical back: Normal range of motion and neck supple.  Skin:    General: Skin is warm and dry.     Capillary Refill: Capillary refill takes less than 2 seconds.     Coloration: Skin is not jaundiced or  pale.     Findings: No bruising, erythema, lesion or rash.  Neurological:     General: No focal deficit present.     Mental Status: He is alert and oriented to person, place, and time. Mental status is at baseline.  Psychiatric:        Mood and Affect: Mood normal.        Behavior: Behavior normal.        Thought Content: Thought content normal.        Judgment: Judgment normal.     Results for orders placed or performed in visit on 05/28/22  Urine Culture   Specimen: Urine   Urine  Result Value Ref Range   Urine Culture, Routine Final report (A)    Organism ID, Bacteria Escherichia coli (A)    Antimicrobial Susceptibility Comment   Microscopic Examination   Urine  Result Value Ref Range   WBC, UA >30 (A) 0 - 5 /hpf   RBC, Urine None seen 0 - 2 /hpf   Epithelial Cells (non renal) 0-10 0 - 10 /hpf    Casts None seen None seen /lpf   Crystals Present (A) N/A   Crystal Type Ammonium Biurate N/A   Bacteria, UA Many (A) None seen/Few  Comprehensive metabolic panel  Result Value Ref Range   Glucose 95 70 - 99 mg/dL   BUN 11 8 - 27 mg/dL   Creatinine, Ser 6.28 0.76 - 1.27 mg/dL   eGFR 97 >31 DV/VOH/6.07   BUN/Creatinine Ratio 12 10 - 24   Sodium 138 134 - 144 mmol/L   Potassium 4.1 3.5 - 5.2 mmol/L   Chloride 99 96 - 106 mmol/L   CO2 25 20 - 29 mmol/L   Calcium 9.5 8.6 - 10.2 mg/dL   Total Protein 7.7 6.0 - 8.5 g/dL   Albumin 4.2 3.9 - 4.9 g/dL   Globulin, Total 3.5 1.5 - 4.5 g/dL   Albumin/Globulin Ratio 1.2 1.2 - 2.2   Bilirubin Total 0.9 0.0 - 1.2 mg/dL   Alkaline Phosphatase 106 44 - 121 IU/L   AST 6 0 - 40 IU/L   ALT 14 0 - 44 IU/L  CBC with Differential/Platelet  Result Value Ref Range   WBC 6.7 3.4 - 10.8 x10E3/uL   RBC 4.78 4.14 - 5.80 x10E6/uL   Hemoglobin 14.9 13.0 - 17.7 g/dL   Hematocrit 37.1 06.2 - 51.0 %   MCV 90 79 - 97 fL   MCH 31.2 26.6 - 33.0 pg   MCHC 34.8 31.5 - 35.7 g/dL   RDW 69.4 85.4 - 62.7 %   Platelets 190 150 - 450 x10E3/uL   Neutrophils 47 Not Estab. %   Lymphs 43 Not Estab. %   Monocytes 6 Not Estab. %   Eos 3 Not Estab. %   Basos 1 Not Estab. %   Neutrophils Absolute 3.1 1.4 - 7.0 x10E3/uL   Lymphocytes Absolute 2.9 0.7 - 3.1 x10E3/uL   Monocytes Absolute 0.4 0.1 - 0.9 x10E3/uL   EOS (ABSOLUTE) 0.2 0.0 - 0.4 x10E3/uL   Basophils Absolute 0.0 0.0 - 0.2 x10E3/uL   Immature Granulocytes 0 Not Estab. %   Immature Grans (Abs) 0.0 0.0 - 0.1 x10E3/uL  Lipid Panel w/o Chol/HDL Ratio  Result Value Ref Range   Cholesterol, Total 214 (H) 100 - 199 mg/dL   Triglycerides 035 (H) 0 - 149 mg/dL   HDL 47 >00 mg/dL   VLDL Cholesterol Cal 42 (H) 5 - 40 mg/dL   LDL  Chol Calc (NIH) 125 (H) 0 - 99 mg/dL  PSA  Result Value Ref Range   Prostate Specific Ag, Serum 2.2 0.0 - 4.0 ng/mL  TSH  Result Value Ref Range   TSH 1.760 0.450 - 4.500 uIU/mL   Urinalysis, Routine w reflex microscopic  Result Value Ref Range   Specific Gravity, UA 1.008 1.005 - 1.030   pH, UA 5.5 5.0 - 7.5   Color, UA Yellow Yellow   Appearance Ur Cloudy (A) Clear   Leukocytes,UA 2+ (A) Negative   Protein,UA Negative Negative/Trace   Glucose, UA Negative Negative   Ketones, UA Negative Negative   RBC, UA Negative Negative   Bilirubin, UA Negative Negative   Urobilinogen, Ur 0.2 0.2 - 1.0 mg/dL   Nitrite, UA Positive (A) Negative   Microscopic Examination See below:   Urine Microalbumin w/creat. ratio  Result Value Ref Range   Creatinine, Urine 42.5 Not Estab. mg/dL   Microalbumin, Urine 5.3 Not Estab. ug/mL   Microalb/Creat Ratio 12 0 - 29 mg/g creat      Assessment & Plan:   Problem List Items Addressed This Visit       Cardiovascular and Mediastinum   Hypertension - Primary    Under good control on current regimen. Continue current regimen. Continue to monitor. Call with any concerns. Refills given. Labs drawn today.       Relevant Medications   losartan (COZAAR) 50 MG tablet   Other Relevant Orders   Comprehensive metabolic panel   Lipid Panel w/o Chol/HDL Ratio     Other   Hyperlipidemia    Rechecking labs today. Await results. Treat as needed.       Relevant Medications   losartan (COZAAR) 50 MG tablet   Other Relevant Orders   Comprehensive metabolic panel   Lipid Panel w/o Chol/HDL Ratio   Other Visit Diagnoses     Needs flu shot       Flu shot given today.   Relevant Orders   Flu vaccine trivalent PF, 6mos and older(Flulaval,Afluria,Fluarix,Fluzone) (Completed)        Follow up plan: Return in about 6 months (around 07/07/2023).

## 2023-01-08 ENCOUNTER — Encounter: Payer: Self-pay | Admitting: Family Medicine

## 2023-01-08 LAB — COMPREHENSIVE METABOLIC PANEL
ALT: 12 [IU]/L (ref 0–44)
AST: 6 [IU]/L (ref 0–40)
Albumin: 4 g/dL (ref 3.9–4.9)
Alkaline Phosphatase: 104 [IU]/L (ref 44–121)
BUN/Creatinine Ratio: 11 (ref 10–24)
BUN: 9 mg/dL (ref 8–27)
Bilirubin Total: 0.5 mg/dL (ref 0.0–1.2)
CO2: 26 mmol/L (ref 20–29)
Calcium: 9.5 mg/dL (ref 8.6–10.2)
Chloride: 98 mmol/L (ref 96–106)
Creatinine, Ser: 0.83 mg/dL (ref 0.76–1.27)
Globulin, Total: 3 g/dL (ref 1.5–4.5)
Glucose: 167 mg/dL — ABNORMAL HIGH (ref 70–99)
Potassium: 4 mmol/L (ref 3.5–5.2)
Sodium: 137 mmol/L (ref 134–144)
Total Protein: 7 g/dL (ref 6.0–8.5)
eGFR: 98 mL/min/{1.73_m2} (ref >59)

## 2023-01-08 LAB — LIPID PANEL W/O CHOL/HDL RATIO
Cholesterol, Total: 194 mg/dL (ref 100–199)
HDL: 37 mg/dL — ABNORMAL LOW (ref >39)
LDL Chol Calc (NIH): 99 mg/dL (ref 0–99)
Triglycerides: 347 mg/dL — ABNORMAL HIGH (ref 0–149)
VLDL Cholesterol Cal: 58 mg/dL — ABNORMAL HIGH (ref 5–40)

## 2023-03-04 ENCOUNTER — Ambulatory Visit: Payer: Medicare Other | Admitting: Dermatology

## 2023-03-04 DIAGNOSIS — C4492 Squamous cell carcinoma of skin, unspecified: Secondary | ICD-10-CM

## 2023-03-04 DIAGNOSIS — Z79899 Other long term (current) drug therapy: Secondary | ICD-10-CM | POA: Diagnosis not present

## 2023-03-04 DIAGNOSIS — L409 Psoriasis, unspecified: Secondary | ICD-10-CM

## 2023-03-04 DIAGNOSIS — D492 Neoplasm of unspecified behavior of bone, soft tissue, and skin: Secondary | ICD-10-CM | POA: Diagnosis not present

## 2023-03-04 DIAGNOSIS — L578 Other skin changes due to chronic exposure to nonionizing radiation: Secondary | ICD-10-CM

## 2023-03-04 DIAGNOSIS — C44329 Squamous cell carcinoma of skin of other parts of face: Secondary | ICD-10-CM | POA: Diagnosis not present

## 2023-03-04 DIAGNOSIS — W908XXA Exposure to other nonionizing radiation, initial encounter: Secondary | ICD-10-CM

## 2023-03-04 DIAGNOSIS — L570 Actinic keratosis: Secondary | ICD-10-CM | POA: Diagnosis not present

## 2023-03-04 DIAGNOSIS — Z7189 Other specified counseling: Secondary | ICD-10-CM

## 2023-03-04 DIAGNOSIS — C44320 Squamous cell carcinoma of skin of unspecified parts of face: Secondary | ICD-10-CM

## 2023-03-04 DIAGNOSIS — D489 Neoplasm of uncertain behavior, unspecified: Secondary | ICD-10-CM

## 2023-03-04 HISTORY — DX: Squamous cell carcinoma of skin, unspecified: C44.92

## 2023-03-04 NOTE — Patient Instructions (Addendum)
Biopsy Wound Care Instructions  Leave the original bandage on for 24 hours if possible.  If the bandage becomes soaked or soiled before that time, it is OK to remove it and examine the wound.  A small amount of post-operative bleeding is normal.  If excessive bleeding occurs, remove the bandage, place gauze over the site and apply continuous pressure (no peeking) over the area for 30 minutes. If this does not work, please call our clinic as soon as possible or page your doctor if it is after hours.   Once a day, cleanse the wound with soap and water. It is fine to shower. If a thick crust develops you may use a Q-tip dipped into dilute hydrogen peroxide (mix 1:1 with water) to dissolve it.  Hydrogen peroxide can slow the healing process, so use it only as needed.    After washing, apply petroleum jelly (Vaseline) or an antibiotic ointment if your doctor prescribed one for you, followed by a bandage.    For best healing, the wound should be covered with a layer of ointment at all times. If you are not able to keep the area covered with a bandage to hold the ointment in place, this may mean re-applying the ointment several times a day.  Continue this wound care until the wound has healed and is no longer open.   Itching and mild discomfort is normal during the healing process. However, if you develop pain or severe itching, please call our office.   If you have any discomfort, you can take Tylenol (acetaminophen) or ibuprofen as directed on the bottle. (Please do not take these if you have an allergy to them or cannot take them for another reason).  Some redness, tenderness and white or yellow material in the wound is normal healing.  If the area becomes very sore and red, or develops a thick yellow-green material (pus), it may be infected; please notify us.    If you have stitches, return to clinic as directed to have the stitches removed. You will continue wound care for 2-3 days after the stitches  are removed.   Wound healing continues for up to one year following surgery. It is not unusual to experience pain in the scar from time to time during the interval.  If the pain becomes severe or the scar thickens, you should notify the office.    A slight amount of redness in a scar is expected for the first six months.  After six months, the redness will fade and the scar will soften and fade.  The color difference becomes less noticeable with time.  If there are any problems, return for a post-op surgery check at your earliest convenience.  To improve the appearance of the scar, you can use silicone scar gel, cream, or sheets (such as Mederma or Serica) every night for up to one year. These are available over the counter (without a prescription).  Please call our office at [phone] for any questions or concerns.   Actinic keratoses are precancerous spots that appear secondary to cumulative UV radiation exposure/sun exposure over time. They are chronic with expected duration over 1 year. A portion of actinic keratoses will progress to squamous cell carcinoma of the skin. It is not possible to reliably predict which spots will progress to skin cancer and so treatment is recommended to prevent development of skin cancer.  Recommend daily broad spectrum sunscreen SPF 30+ to sun-exposed areas, reapply every 2 hours as needed.  Recommend  staying in the shade or wearing long sleeves, sun glasses (UVA+UVB protection) and wide brim hats (4-inch brim around the entire circumference of the hat). Call for new or changing lesions.   Cryotherapy Aftercare  Wash gently with soap and water everyday.   Apply Vaseline and Band-Aid daily until healed.      Due to recent changes in healthcare laws, you may see results of your pathology and/or laboratory studies on MyChart before the doctors have had a chance to review them. We understand that in some cases there may be results that are confusing or  concerning to you. Please understand that not all results are received at the same time and often the doctors may need to interpret multiple results in order to provide you with the best plan of care or course of treatment. Therefore, we ask that you please give [place] 2 business days to thoroughly review all your results before contacting the office for clarification. Should we see a critical lab result, you will be contacted sooner.   If You Need Anything After Your Visit  If you have any questions or concerns for your doctor, please call our main line at [phone] and press option 4 to reach your doctor's medical assistant. If no one answers, please leave a voicemail as directed and we will return your call as soon as possible. Messages left after 4 pm will be answered the following business day.   You may also send [place] a message via MyChart. We typically respond to MyChart messages within 1-2 business days.  For prescription refills, please ask your pharmacy to contact our office. Our fax number is [phone].  If you have an urgent issue when the clinic is closed that cannot wait until the next business day, you can page your doctor at the number below.    Please note that while we do our best to be available for urgent issues outside of office hours, we are not available 24/7.   If you have an urgent issue and are unable to reach [place], you may choose to seek medical care at your doctor's office, retail clinic, urgent care center, or emergency room.  If you have a medical emergency, please immediately call 911 or go to the emergency department.  Pager Numbers  - Dr. Gwen Pounds: [phone]  - Dr. Roseanne Reno: [phone]  - Dr. Katrinka Blazing: [phone]   In the event of inclement weather, please call our main line at [phone] for an update on the status of any delays or closures.  Dermatology Medication Tips: Please keep the boxes that topical medications come in in order to help keep  track of the instructions about where and how to use these. Pharmacies typically print the medication instructions only on the boxes and not directly on the medication tubes.   If your medication is too expensive, please contact our office at [phone] option 4 or send [place] a message through MyChart.   We are unable to tell what your co-pay for medications will be in advance as this is different depending on your insurance coverage. However, we may be able to find a substitute medication at lower cost or fill out paperwork to get insurance to cover a needed medication.   If a prior authorization is required to get your medication covered by your insurance company, please allow [place] 1-2 business days to complete this process.  Drug prices often vary depending on where the prescription is filled and some pharmacies may offer cheaper prices.  The  website www.goodrx.com contains coupons for medications through different pharmacies. The prices here do not account for what the cost may be with help from insurance (it may be cheaper with your insurance), but the website can give you the price if you did not use any insurance.  - You can print the associated coupon and take it with your prescription to the pharmacy.  - You may also stop by our office during regular business hours and pick up a GoodRx coupon card.  - If you need your prescription sent electronically to a different pharmacy, notify our office through [facility] or by phone at [phone] option 4.     Si Usted Necesita Algo Despus de Su Visita  Tambin puede enviarnos un mensaje a travs de Clinical cytogeneticist. Por lo general respondemos a los mensajes de MyChart en el transcurso de 1 a 2 das hbiles.  Para renovar recetas, por favor pida a su farmacia que se ponga en contacto con nuestra oficina. Annie Sable de fax es [place] [phone].  Si tiene un asunto urgente cuando la clnica est cerrada y que no puede esperar hasta el  siguiente da hbil, puede llamar/localizar a su doctor(a) al nmero que aparece a continuacin.   Por favor, tenga en cuenta que aunque hacemos todo lo posible para estar disponibles para asuntos urgentes fuera del horario de [place], no estamos disponibles las 24 horas del da, los 7 [address]  [address] de la [place].   Si tiene un problema urgente y no puede comunicarse con nosotros, puede optar por buscar atencin mdica  en el consultorio de su doctor(a), en una clnica privada, en un centro de atencin urgente o en una sala de emergencias.  Si tiene Engineer, drilling, por favor llame inmediatamente al 911 o vaya a la sala de emergencias.  Nmeros de bper  - Dr. Gwen Pounds: [phone]  - Dra. Roseanne Reno: [phone]  - Dr. Katrinka Blazing: [phone]   En caso de inclemencias del tiempo, por favor llame a Lacy Duverney principal al [phone] para una actualizacin sobre el Rena Lara de cualquier retraso o cierre.  Consejos para la medicacin en dermatologa: Por favor, guarde las cajas en las que vienen los medicamentos de uso tpico para ayudarle a seguir las instrucciones sobre dnde y cmo usarlos. Las farmacias generalmente imprimen las instrucciones del medicamento slo en las cajas y no directamente en los tubos del [place].   Si su medicamento es muy caro, por favor, pngase en contacto con Rolm Gala llamando al [phone] y presione la opcin 4 o envenos un mensaje a travs de Clinical cytogeneticist.   No podemos decirle cul ser su copago por los medicamentos por adelantado ya que esto es diferente dependiendo de la cobertura de su seguro. Sin embargo, es posible que podamos encontrar un medicamento sustituto a Audiological scientist un formulario para que el seguro cubra el medicamento que se considera necesario.   Si se requiere una autorizacin previa para que su compaa de seguros [place] su medicamento, por favor permtanos de 1 a 2 das hbiles para completar [address]  Los precios de los  medicamentos varan con frecuencia dependiendo del Environmental consultant de dnde se surte la receta y alguna farmacias pueden ofrecer precios ms baratos.  El sitio web www.goodrx.com tiene cupones para medicamentos de Health and safety inspector. Los precios aqu no tienen en cuenta lo que podra costar con la ayuda del seguro (puede ser ms barato con su seguro), pero el sitio web puede darle el precio si no utiliz Tourist information centre manager.  -  Puede imprimir el cupn correspondiente y llevarlo con su receta a la farmacia.  - Tambin puede pasar por nuestra oficina durante el horario de atencin regular y Education officer, museum una tarjeta de cupones de GoodRx.  - Si necesita que su receta se enve electrnicamente a una farmacia diferente, informe a nuestra oficina a travs de MyChart de [place] o por telfono llamando al [phone] y presione la opcin 4.

## 2023-03-04 NOTE — Progress Notes (Signed)
Follow-Up Visit   Subjective  Bryan West is a 62 y.o. male who presents for the following: 6 month ak and psoriasis follow up. Father here with patient and contributes to history. Father reports patient has a red area at left jaw/mandible area he would like checked.   Reports psoriasis has improved at buttocks and scalp. Currently just using baby oil to affected areas and vtama cream when needed for flares. Also using ketoconazole shampoo.   The patient has spots, moles and lesions to be evaluated, some may be new or changing and the patient may have concern these could be cancer.   The following portions of the chart were reviewed this encounter and updated as appropriate: medications, allergies, medical history  Review of Systems:  No other skin or systemic complaints except as noted in HPI or Assessment and Plan.  Objective  Well appearing patient in no apparent distress; mood and affect are within normal limits.   A focused examination was performed of the following areas: Buttock, scalp, face   Relevant exam findings are noted in the Assessment and Plan.  face and ears x 3, arms and hands x 20 (23) Erythematous thin papules/macules with gritty scale.   left proximal mandible 2.1 x 1 cm indurated papule            Assessment & Plan   PSORIASIS Buttocks, scalp Exam: hyperpigmentation at buttock  1 % BSA on moisturizers and vtama for recurrence of flares   Chronic and persistent condition with duration or expected duration over one year. Condition is improving with treatment but not currently at goal.  Treatment Plan: Cont Vtama qd to aa psoriasis on buttocks until clear, then prn flares Patient given sample to use at affected areas  Long term medication management.  Patient is using long term (months to years) prescription medication  to control their dermatologic condition.  These medications require periodic monitoring to evaluate for efficacy and side  effects and may require periodic laboratory monitoring.   Counseling on psoriasis and coordination of care  psoriasis is a chronic non-curable, but treatable genetic/hereditary disease that may have other systemic features affecting other organ systems such as joints (Psoriatic Arthritis). It is associated with an increased risk of inflammatory bowel disease, heart disease, non-alcoholic fatty liver disease, and depression.  Treatments include light and laser treatments; topical medications; and systemic medications including oral and injectables.  Actinic keratosis (23) face and ears x 3, arms and hands x 20  Actinic keratoses are precancerous spots that appear secondary to cumulative UV radiation exposure/sun exposure over time. They are chronic with expected duration over 1 year. A portion of actinic keratoses will progress to squamous cell carcinoma of the skin. It is not possible to reliably predict which spots will progress to skin cancer and so treatment is recommended to prevent development of skin cancer.  Recommend daily broad spectrum sunscreen SPF 30+ to sun-exposed areas, reapply every 2 hours as needed.  Recommend staying in the shade or wearing long sleeves, sun glasses (UVA+UVB protection) and wide brim hats (4-inch brim around the entire circumference of the hat). Call for new or changing lesions.  Destruction of lesion - face and ears x 3, arms and hands x 20 (23) Complexity: simple   Destruction method: cryotherapy   Informed consent: discussed and consent obtained   Timeout:  patient name, date of birth, surgical site, and procedure verified Lesion destroyed using liquid nitrogen: Yes   Region frozen until ice ball  extended beyond lesion: Yes   Outcome: patient tolerated procedure well with no complications   Post-procedure details: wound care instructions given    Neoplasm of uncertain behavior left proximal mandible  Epidermal / dermal shaving  Lesion diameter (cm):   2.1 Informed consent: discussed and consent obtained   Timeout: patient name, date of birth, surgical site, and procedure verified   Procedure prep:  Patient was prepped and draped in usual sterile fashion Prep type:  Isopropyl alcohol Anesthesia: the lesion was anesthetized in a standard fashion   Anesthetic:  1% lidocaine w/ epinephrine 1-100,000 buffered w/ 8.4% NaHCO3 Instrument used: flexible razor blade   Hemostasis achieved with: pressure, aluminum chloride and electrodesiccation   Outcome: patient tolerated procedure well   Post-procedure details: sterile dressing applied and wound care instructions given   Dressing type: bandage and petrolatum    Destruction of lesion Complexity: extensive   Destruction method: electrodesiccation and curettage   Informed consent: discussed and consent obtained   Timeout:  patient name, date of birth, surgical site, and procedure verified Procedure prep:  Patient was prepped and draped in usual sterile fashion Prep type:  Isopropyl alcohol Anesthesia: the lesion was anesthetized in a standard fashion   Anesthetic:  1% lidocaine w/ epinephrine 1-100,000 buffered w/ 8.4% NaHCO3 Curettage performed in three different directions: Yes   Electrodesiccation performed over the curetted area: Yes   Lesion length (cm):  2.1 Lesion width (cm):  2.1 Margin per side (cm):  0.2 Final wound size (cm):  2.5 Hemostasis achieved with:  pressure, aluminum chloride and electrodesiccation Outcome: patient tolerated procedure well with no complications   Post-procedure details: sterile dressing applied and wound care instructions given   Dressing type: bandage and petrolatum    Specimen 1 - Surgical pathology Differential Diagnosis: r/o scc vs bcc   Check Margins: No    Shv removal edc  today   ACTINIC DAMAGE - chronic, secondary to cumulative UV radiation exposure/sun exposure over time - diffuse scaly erythematous macules with underlying  dyspigmentation - Recommend daily broad spectrum sunscreen SPF 30+ to sun-exposed areas, reapply every 2 hours as needed.  - Recommend staying in the shade or wearing long sleeves, sun glasses (UVA+UVB protection) and wide brim hats (4-inch brim around the entire circumference of the hat). - Call for new or changing lesions.   Return in about 6 months (around 09/01/2023) for ak and psoriasis follow up.  IAsher Muir, CMA, am acting as scribe for Armida Sans, MD.   Documentation: I have reviewed the above documentation for accuracy and completeness, and I agree with the above.  Armida Sans, MD

## 2023-03-09 ENCOUNTER — Encounter: Payer: Self-pay | Admitting: Dermatology

## 2023-03-09 LAB — SURGICAL PATHOLOGY

## 2023-03-10 ENCOUNTER — Telehealth: Payer: Self-pay

## 2023-03-10 NOTE — Telephone Encounter (Signed)
Patient informed of pathology result

## 2023-03-10 NOTE — Telephone Encounter (Signed)
-----   Message from Armida Sans sent at 03/09/2023  7:02 PM EST ----- INAL DIAGNOSIS        1. Skin, left proximal mandible :       WELL DIFFERENTIATED SQUAMOUS CELL CARCINOMA   Cancer = SCC Already treated Recheck next visit

## 2023-05-25 ENCOUNTER — Encounter: Payer: Self-pay | Admitting: Family Medicine

## 2023-05-25 ENCOUNTER — Ambulatory Visit: Payer: Medicare Other | Admitting: Emergency Medicine

## 2023-05-25 VITALS — Ht 68.0 in | Wt 182.0 lb

## 2023-05-25 DIAGNOSIS — Z0000 Encounter for general adult medical examination without abnormal findings: Secondary | ICD-10-CM | POA: Diagnosis not present

## 2023-05-25 NOTE — Progress Notes (Signed)
Subjective:   Bryan West is a 63 y.o. male who presents for Medicare Annual/Subsequent preventive examination.  This patient declined Interactive audio and Acupuncturist. Therefore the visit was completed with audio only.   Visit Complete: Virtual I connected with  Waylon C Bembenek on 05/25/23 by a audio enabled telemedicine application and verified that I am speaking with the correct person using two identifiers.  Patient Location: Home  Provider Location: Home Office  I discussed the limitations of evaluation and management by telemedicine. The patient expressed understanding and agreed to proceed.  Vital Signs: Because this visit was a virtual/telehealth visit, some criteria may be missing or patient reported. Any vitals not documented were not able to be obtained and vitals that have been documented are patient reported.   Cardiac Risk Factors include: hypertension;dyslipidemia     Objective:    Today's Vitals   05/25/23 1339  Weight: 182 lb (82.6 kg)  Height: 5\' 8"  (1.727 m)   Body mass index is 27.67 kg/m.     05/25/2023    1:54 PM 05/19/2022   11:34 AM 03/19/2021   10:13 AM 03/18/2020   10:19 AM  Advanced Directives  Does Patient Have a Medical Advance Directive? Yes No No No  Type of Estate agent of [place];Living will     Does patient want to make changes to medical advance directive? No - Patient declined     Copy of Healthcare Power of Attorney in Chart? No - copy requested     Would patient like information on creating a medical advance directive?  No - Patient declined No - Patient declined     Current Medications (verified) Outpatient Encounter Medications as of 05/25/2023  Medication Sig   calcipotriene-betamethasone (TACLONEX) ointment APPLY TO AFFECTED AREAS DAILY IF NEEDED FOR FLARES   ketoconazole (NIZORAL) 2 % shampoo Apply 1 Application topically 3 (three) times a week. Wash scalp 3 times weekly, let sit 5 minutes  before rinsing out   losartan (COZAAR) 50 MG tablet Take 1 tablet (50 mg total) by mouth daily.   mometasone (ELOCON) 0.1 % lotion Daily up to 5 days per week prn flares   ketoconazole (NIZORAL) 2 % shampoo  (Patient not taking: Reported on 05/25/2023)   mometasone (ELOCON) 0.1 % cream Apply ONCE A DAY topically up to 5 days a week to psoriasis on buttocks or body as needed for flares (Patient not taking: Reported on 05/25/2023)   Roflumilast (ZORYVE) 0.3 % CREA Apply 1 Application topically daily. qd to active areas of psoriasis on buttocks, scalp prn flares (Patient not taking: Reported on 05/25/2023)   Tapinarof (VTAMA) 1 % CREA Apply 1 Application topically daily. qd to active areas of psoriasis on scalp and buttocks until clear, then prn flares (Patient not taking: Reported on 05/25/2023)   No facility-administered encounter medications on file as of 05/25/2023.    Allergies (verified) Patient has no known allergies.   History: Past Medical History:  Diagnosis Date   Hyperbilirubinemia    Hyperlipidemia    Hypertension    Lymphocytosis    MR (mental retardation)    Prostatitis    Prostatitis    Squamous cell carcinoma of skin 03/04/2023   L prox mandible, ED&C   Past Surgical History:  Procedure Laterality Date   CHOLECYSTECTOMY     Family History  Problem Relation Age of Onset   Pulmonary fibrosis Mother    Hypertension Father    Cancer Father  prostate   Cancer Brother        skin   Hypertension Brother    Social History   Socioeconomic History   Marital status: Single    Spouse name: Not on file   Number of children: Not on file   Years of education: Not on file   Highest education level: Not on file  Occupational History   Not on file  Tobacco Use   Smoking status: Never   Smokeless tobacco: Never  Vaping Use   Vaping status: Never Used  Substance and Sexual Activity   Alcohol use: No   Drug use: No   Sexual activity: Not on file  Other Topics  Concern   Not on file  Social History Narrative   Not on file   Social Drivers of Health   Financial Resource Strain: Low Risk  (05/25/2023)   Overall Financial Resource Strain (CARDIA)    Difficulty of Paying Living Expenses: Not hard at all  Food Insecurity: No Food Insecurity (05/25/2023)   Hunger Vital Sign    Worried About Running Out of Food in the Last Year: Never true    Ran Out of Food in the Last Year: Never true  Transportation Needs: No Transportation Needs (05/25/2023)   PRAPARE - Administrator, Civil Service (Medical): No    Lack of Transportation (Non-Medical): No  Physical Activity: Insufficiently Active (05/25/2023)   Exercise Vital Sign    Days of Exercise per Week: 3 days    Minutes of Exercise per Session: 30 min  Stress: Patient Unable To Answer (05/25/2023)   [employer] [employer] - Occupational Stress Questionnaire    Feeling of Stress : Patient unable to answer  Social Connections: Moderately Isolated (05/25/2023)   Social Connection and Isolation Panel [NHANES]    Frequency of Communication with Friends and Family: Twice a week    Frequency of Social Gatherings with Friends and Family: More than three times a week    Attends Religious Services: More than 4 times per year    Active Member of [employer] or Organizations: No    Attends Engineer, structural: Never    Marital Status: Never married    Tobacco Counseling Counseling given: Not Answered   Clinical Intake:  Pre-visit preparation completed: Yes  Pain : No/denies pain     BMI - recorded: 27.67 Nutritional Status: BMI 25 -29 Overweight Nutritional Risks: None Diabetes: No  How often do you need to have someone help you when you read instructions, pamphlets, or other written materials from your doctor or pharmacy?: 5 - Always What is the last grade level you completed in school?: cannot read  Interpreter Needed?: No  Information entered by :: Tora Kindred, CMA   Activities of Daily Living    05/25/2023    1:43 PM  In your present state of health, do you have any difficulty performing the following activities:  Hearing? 0  Vision? 0  Difficulty concentrating or making decisions? 1  Comment intellectual disability  Walking or climbing stairs? 0  Dressing or bathing? 0  Doing errands, shopping? 1  Comment doesn't drive, brother and caregiver takes him  Quarry manager and eating ? Y  Comment needs help with preparing food, eats own his own  Using the Toilet? N  In the past six months, have you accidently leaked urine? N  Do you have problems with loss of bowel control? N  Managing your Medications? N  Managing your  Finances? Y  Comment brother Programme researcher, broadcasting/film/video or managing your Housekeeping? Y  Comment brother handles    Patient Care Team: Dorcas Carrow, DO as PCP - General (Family Medicine) Galen Manila, MD as Referring Physician (Ophthalmology)  Indicate any recent Medical Services you may have received from other than [facility] providers in the past year (date may be approximate).     Assessment:   This is a routine wellness examination for Mylik.  Hearing/Vision screen Hearing Screening - Comments:: Gets eye exams every 2 years, Dr. Druscilla Brownie @ [facility] Vision Screening - Comments:: Gets eye exams every 2 years. [facility]usc[facility]   Goals Addressed             This Visit's Progress    Patient Stated       Exercise more      Depression Screen    05/25/2023    1:52 PM 01/07/2023    1:13 PM 05/28/2022    1:16 PM 05/19/2022   11:33 AM 07/10/2021    1:12 PM 03/19/2021   10:12 AM 03/18/2020   10:38 AM  PHQ 2/9 Scores  PHQ - 2 Score  0 0 0 0 0 0  PHQ- 9 Score  0 0 0 0    Exception Documentation Other- indicate reason in comment box        Not completed intellectual disability          Fall Risk    05/25/2023    1:57 PM 01/07/2023    1:13 PM 05/28/2022    1:16 PM 05/19/2022    11:35 AM 03/19/2021   10:06 AM  Fall Risk   Falls in the past year? 0 0 0 0 0  Number falls in past yr: 0 0 0 0 0  Injury with Fall? 0 0 0 0 0  Risk for fall due to : No Fall Risks No Fall Risks No Fall Risks No Fall Risks   Follow up Falls prevention discussed;Falls evaluation completed Falls evaluation completed;Education provided;Falls prevention discussed Falls evaluation completed Falls prevention discussed;Falls evaluation completed Falls evaluation completed;Falls prevention discussed    MEDICARE RISK AT HOME: Medicare Risk at Home Any stairs in or around the home?: Yes (wheelchair ramp) If so, are there any without handrails?: No Home free of loose throw rugs in walkways, pet beds, electrical cords, etc?: Yes Adequate lighting in your home to reduce risk of falls?: Yes Life alert?: No Use of a cane, walker or w/c?: No Grab bars in the bathroom?: No Shower chair or bench in shower?: No Elevated toilet seat or a handicapped toilet?: Yes  TIMED UP AND GO:  Was the test performed?  No    Cognitive Function:        05/25/2023    1:59 PM 02/26/2017    3:19 PM  6CIT Screen  What Year? 4 points 4 points  What month? 3 points 3 points  What time? 3 points   Count back from 20 4 points   Months in reverse 4 points   Repeat phrase 10 points   Total Score 28 points     Immunizations Immunization History  Administered Date(s) Administered   Influenza, Seasonal, Injecte, Preservative Fre 01/07/2023   Influenza,inj,Quad PF,6+ Mos 01/31/2016, 02/26/2017, 12/23/2017, 02/28/2019, 03/18/2020, 01/09/2021, 02/09/2022   Td 05/23/2007, 06/17/2017    TDAP status: Up to date  Flu Vaccine status: Up to date  Pneumococcal vaccine status: Due, Education has been provided regarding the importance of  this vaccine. Advised may receive this vaccine at local pharmacy or Health Dept. Aware to provide a copy of the vaccination record if obtained from local pharmacy or Health Dept.  Verbalized acceptance and understanding.  Covid-19 vaccine status: Declined, Education has been provided regarding the importance of this vaccine but patient still declined. Advised may receive this vaccine at local pharmacy or Health Dept.or vaccine clinic. Aware to provide a copy of the vaccination record if obtained from local pharmacy or Health Dept. Verbalized acceptance and understanding.  Qualifies for Shingles Vaccine? Yes   Zostavax completed No   Shingrix Completed?: No.    Education has been provided regarding the importance of this vaccine. Patient has been advised to call insurance company to determine out of pocket expense if they have not yet received this vaccine. Advised may also receive vaccine at local pharmacy or Health Dept. Verbalized acceptance and understanding. Patient declined  Screening Tests Health Maintenance  Topic Date Due   Zoster Vaccines- Shingrix (1 of 2) Never done   Fecal DNA (Cologuard)  03/03/2024   Medicare Annual Wellness (AWV)  05/24/2024   DTaP/Tdap/Td (3 - Tdap) 06/18/2027   INFLUENZA VACCINE  Completed   Hepatitis C Screening  Completed   HIV Screening  Completed   HPV VACCINES  Aged Out   Colonoscopy  Discontinued   COVID-19 Vaccine  Discontinued    Health Maintenance  Health Maintenance Due  Topic Date Due   Zoster Vaccines- Shingrix (1 of 2) Never done    Colorectal cancer screening: Type of screening: Cologuard. Completed 03/03/21. Repeat every 3 years  Lung Cancer Screening: (Low Dose CT Chest recommended if Age 47-80 years, 20 pack-year currently smoking OR have quit w/in 15years.) does not qualify.   Lung Cancer Screening Referral: n/a  Additional Screening:  Hepatitis C Screening: does not qualify; Completed 02/28/19  Vision Screening: Recommended annual ophthalmology exams for early detection of glaucoma and other disorders of the eye.  Dental Screening: Recommended annual dental exams for proper oral  hygiene    Community Resource Referral / Chronic Care Management: CRR required this visit?  No   CCM required this visit?  No     Plan:     I have personally reviewed and noted the following in the patient's chart:   Medical and social history Use of alcohol, tobacco or illicit drugs  Current medications and supplements including opioid prescriptions. Patient is not currently taking opioid prescriptions. Functional ability and status Nutritional status Physical activity Advanced directives List of other physicians Hospitalizations, surgeries, and ER visits in previous 12 months Vitals Screenings to include cognitive, depression, and falls Referrals and appointments  In addition, I have reviewed and discussed with patient certain preventive protocols, quality metrics, and best practice recommendations. A written personalized care plan for preventive services as well as general preventive health recommendations were provided to patient.     Tora Kindred, CMA   05/25/2023   After Visit Summary: (Mail) Due to this being a telephonic visit, the after visit summary with patients personalized plan was offered to patient via mail   Nurse Notes:  Hessie Diener, brother and caregiver present for today's visit 6 CIT Score - 28 (intellectual disability) Needs pneumonia vaccine

## 2023-05-25 NOTE — Patient Instructions (Addendum)
Mr. Scarano , Thank you for taking time to come for your Medicare Wellness Visit. I appreciate your ongoing commitment to your health goals. Please review the following plan we discussed and let me know if I can assist you in the future.   Referrals/Orders/Follow-Ups/Clinician Recommendations: Get a pneumonia vaccine at your convenience. Recommend getting an eye exam every 2 years.  This is a list of the screening recommended for you and due dates:  Health Maintenance  Topic Date Due   Zoster (Shingles) Vaccine (1 of 2) Never done   Cologuard (Stool DNA test)  03/03/2024   Medicare Annual Wellness Visit  05/24/2024   DTaP/Tdap/Td vaccine (3 - Tdap) 06/18/2027   Flu Shot  Completed   Hepatitis C Screening  Completed   HIV Screening  Completed   HPV Vaccine  Aged Out   Colon Cancer Screening  Discontinued   COVID-19 Vaccine  Discontinued    Advanced directives: (Copy Requested) Please bring a copy of your health care power of attorney and living will to the office to be added to your chart at your convenience.  Next Medicare Annual Wellness Visit scheduled for next year: Yes, 05/30/24 @ 1:50am (phone visit)

## 2023-06-07 ENCOUNTER — Encounter: Payer: Self-pay | Admitting: Family Medicine

## 2023-06-07 ENCOUNTER — Ambulatory Visit (INDEPENDENT_AMBULATORY_CARE_PROVIDER_SITE_OTHER): Payer: Medicare Other | Admitting: Family Medicine

## 2023-06-07 VITALS — BP 149/83 | HR 82 | Temp 97.6°F | Ht 68.0 in | Wt 178.6 lb

## 2023-06-07 DIAGNOSIS — J069 Acute upper respiratory infection, unspecified: Secondary | ICD-10-CM | POA: Diagnosis not present

## 2023-06-07 MED ORDER — ALBUTEROL SULFATE (2.5 MG/3ML) 0.083% IN NEBU
2.5000 mg | INHALATION_SOLUTION | Freq: Once | RESPIRATORY_TRACT | Status: AC
  Administered 2023-06-07: 2.5 mg via RESPIRATORY_TRACT

## 2023-06-07 MED ORDER — PREDNISONE 50 MG PO TABS: 50.0000 mg | ORAL_TABLET | Freq: Every day | ORAL | 0 refills | Status: DC

## 2023-06-07 NOTE — Progress Notes (Signed)
 BP (!) 149/83 (BP Location: Left Arm, Cuff Size: Normal)   Pulse 82   Temp 97.6 F (36.4 C) (Oral)   Ht 5\' 8"  (1.727 m)   Wt 178 lb 9.6 oz (81 kg)   SpO2 98%   BMI 27.16 kg/m    Subjective:    Patient ID: Bryan West, male    DOB: 1960/10/20, 63 y.o.   MRN: [id number]  HPI: Bryan West is a 63 y.o. male  Chief Complaint  Patient presents with   Cough   Sore Throat    Started Friday, taking Mucinex every 12 hours   UPPER RESPIRATORY TRACT INFECTION Duration: 3 days Worst symptom: cough Fever: no Cough: yes Shortness of breath: yes Wheezing:  unsure Chest pain: yes, with cough Chest tightness: yes Chest congestion: yes Nasal congestion: yes Runny nose: yes Post nasal drip: no Sneezing: no Sore throat: yes Swollen glands: no Sinus pressure: no Headache: yes Face pain: no Toothache: no Ear pain: no  Ear pressure: no  Eyes red/itching:no Eye drainage/crusting: no  Vomiting: no Rash: no Fatigue: yes Sick contacts: yes Strep contacts: no  Context: worse Recurrent sinusitis: no Relief with OTC cold/cough medications: no  Treatments attempted: mucinex   Relevant past medical, surgical, family and social history reviewed and updated as indicated. Interim medical history since our last visit reviewed. Allergies and medications reviewed and updated.  Review of Systems  Constitutional:  Positive for fatigue. Negative for activity change, appetite change, chills, diaphoresis, fever and unexpected weight change.  HENT:  Positive for congestion, postnasal drip, rhinorrhea and sore throat. Negative for dental problem, drooling, ear discharge, ear pain, facial swelling, hearing loss, mouth sores, nosebleeds, sinus pressure, sinus pain, sneezing, tinnitus, trouble swallowing and voice change.   Eyes: Negative.   Respiratory:  Positive for cough, chest tightness and shortness of breath. Negative for apnea, choking, wheezing and stridor.   Cardiovascular: Negative.    Gastrointestinal: Negative.   Musculoskeletal: Negative.   Neurological: Negative.   Psychiatric/Behavioral: Negative.      Per HPI unless specifically indicated above     Objective:    BP (!) 149/83 (BP Location: Left Arm, Cuff Size: Normal)   Pulse 82   Temp 97.6 F (36.4 C) (Oral)   Ht 5\' 8"  (1.727 m)   Wt 178 lb 9.6 oz (81 kg)   SpO2 98%   BMI 27.16 kg/m   Wt Readings from Last 3 Encounters:  06/07/23 178 lb 9.6 oz (81 kg)  05/25/23 182 lb (82.6 kg)  01/07/23 174 lb 12.8 oz (79.3 kg)    Physical Exam Vitals and nursing note reviewed.  Constitutional:      General: He is not in acute distress.    Appearance: Normal appearance. He is not ill-appearing, toxic-appearing or diaphoretic.  HENT:     Head: Normocephalic and atraumatic.     Right Ear: Tympanic membrane, ear canal and external ear normal. No drainage, swelling or tenderness. No middle ear effusion. Tympanic membrane is not erythematous.     Left Ear: Tympanic membrane, ear canal and external ear normal. No drainage or tenderness.  No middle ear effusion. Tympanic membrane is not erythematous.     Nose: Rhinorrhea present. No congestion.     Mouth/Throat:     Mouth: Mucous membranes are moist. No oral lesions.     Pharynx: Oropharynx is clear. Posterior oropharyngeal erythema present. No pharyngeal swelling, oropharyngeal exudate or uvula swelling.  Eyes:     General: No scleral  icterus.       Right eye: No discharge.        Left eye: No discharge.     Extraocular Movements: Extraocular movements intact.     Conjunctiva/sclera: Conjunctivae normal.     Pupils: Pupils are equal, round, and reactive to light.  Cardiovascular:     Rate and Rhythm: Normal rate and regular rhythm.     Pulses: Normal pulses.     Heart sounds: Normal heart sounds. No murmur heard.    No friction rub. No gallop.  Pulmonary:     Effort: Pulmonary effort is normal. No respiratory distress.     Breath sounds: No stridor. No  wheezing, rhonchi or rales.     Comments: Tight breath sounds, little air movement Chest:     Chest wall: No tenderness.  Musculoskeletal:        General: Normal range of motion.     Cervical back: Normal range of motion and neck supple.  Skin:    General: Skin is warm and dry.     Capillary Refill: Capillary refill takes less than 2 seconds.     Coloration: Skin is not jaundiced or pale.     Findings: No bruising, erythema, lesion or rash.  Neurological:     General: No focal deficit present.     Mental Status: He is alert and oriented to person, place, and time. Mental status is at baseline.  Psychiatric:        Mood and Affect: Mood normal.        Behavior: Behavior normal.        Thought Content: Thought content normal.        Judgment: Judgment normal.     Results for orders placed or performed in visit on 03/04/23  Surgical pathology   Collection Time: 03/04/23 12:00 AM  Result Value Ref Range   SURGICAL PATHOLOGY      SURGICAL PATHOLOGY [facility] [address], [address] [place], [place] [address] Telephone [phone] or [phone] Fax [phone]  REPORT OF DERMATOPATHOLOGY   Accession #: [id number] Patient Name: Bryan, West Visit # : [id number]  MRN: [id number] Cytotechnologist: Thomasene Lot, Dermatopathologist, Electronic Signature DOB/Age 02/16/1961 (Age: 64) Gender: M Collected Date: 03/04/2023 Received Date: 03/04/2023  FINAL DIAGNOSIS       1. Skin, left proximal mandible :       WELL DIFFERENTIATED SQUAMOUS CELL CARCINOMA       DATE SIGNED OUT: 03/09/2023 ELECTRONIC SIGNATURE : Depcik-Smith Md, Natalie, Dermatopathologist, Electronic Signature  MICROSCOPIC DESCRIPTION 1. There is a proliferation of atypical epithelial cells with squamous differentiation invading the dermis.  This is a well differentiated squamous cell carcinoma.  CASE COMMENTS STAINS USED IN DIAGNOSIS: H&E *Level through  block *Level through bloc k *Level through block *Level through block    CLINICAL HISTORY  SPECIMEN(S) OBTAINED 1. Skin, Left Proximal Mandible  SPECIMEN COMMENTS: 1. 2.1 x 1 cm indurated papule SPECIMEN CLINICAL INFORMATION: 1. Neoplasm of uncertain behavior, R/O SCC vs BCC    Gross Description 1. Formalin fixed specimen received:  16 X 11 X 1 MM, TOTO (6 P) (1 B) ( hwc )        Report signed out from the following location(s) [place] [address], [place] [address] CLIA #: [id number]  [facility] [address] [place], [place] [address] CLIA #: [id number]       Assessment & Plan:  Problem List Items Addressed This Visit   None Visit Diagnoses       Upper respiratory tract infection, unspecified type    -  Primary   Flu and strep negative. Await COVID. Lung clear after neb. Will treat with burst of prednisone. Call with any concerns or if not getting better.   Relevant Medications   albuterol (PROVENTIL) (2.5 MG/3ML) 0.083% nebulizer solution 2.5 mg (Completed)   Other Relevant Orders   Veritor Flu A/B Waived   Rapid Strep Screen ([facility] ONLY)   Novel Coronavirus, NAA ([facility])        Follow up plan: Return for As scheduled.

## 2023-06-09 LAB — NOVEL CORONAVIRUS, NAA: SARS-CoV-2, NAA: NOT DETECTED

## 2023-06-10 LAB — VERITOR FLU A/B WAIVED
Influenza A: NEGATIVE
Influenza B: NEGATIVE

## 2023-06-10 LAB — RAPID STREP SCREEN (MED CTR MEBANE ONLY): Strep Gp A Ag, IA W/Reflex: NEGATIVE

## 2023-06-10 LAB — CULTURE, GROUP A STREP

## 2023-07-05 ENCOUNTER — Ambulatory Visit: Payer: Medicare Other | Admitting: Family Medicine

## 2023-07-08 ENCOUNTER — Ambulatory Visit: Payer: Self-pay | Admitting: Family Medicine

## 2023-07-27 DIAGNOSIS — H6063 Unspecified chronic otitis externa, bilateral: Secondary | ICD-10-CM | POA: Diagnosis not present

## 2023-07-27 DIAGNOSIS — H6123 Impacted cerumen, bilateral: Secondary | ICD-10-CM | POA: Diagnosis not present

## 2023-08-08 IMAGING — DX DG SHOULDER 2+V*L*
3 series · 3 of 3 positions shown · non-contrast
Comparison: None.

CLINICAL DATA: Left upper extremity pain.

EXAM:
LEFT SHOULDER - 2+ VIEW; LEFT HUMERUS - 2+ VIEW

[shoulder ap]
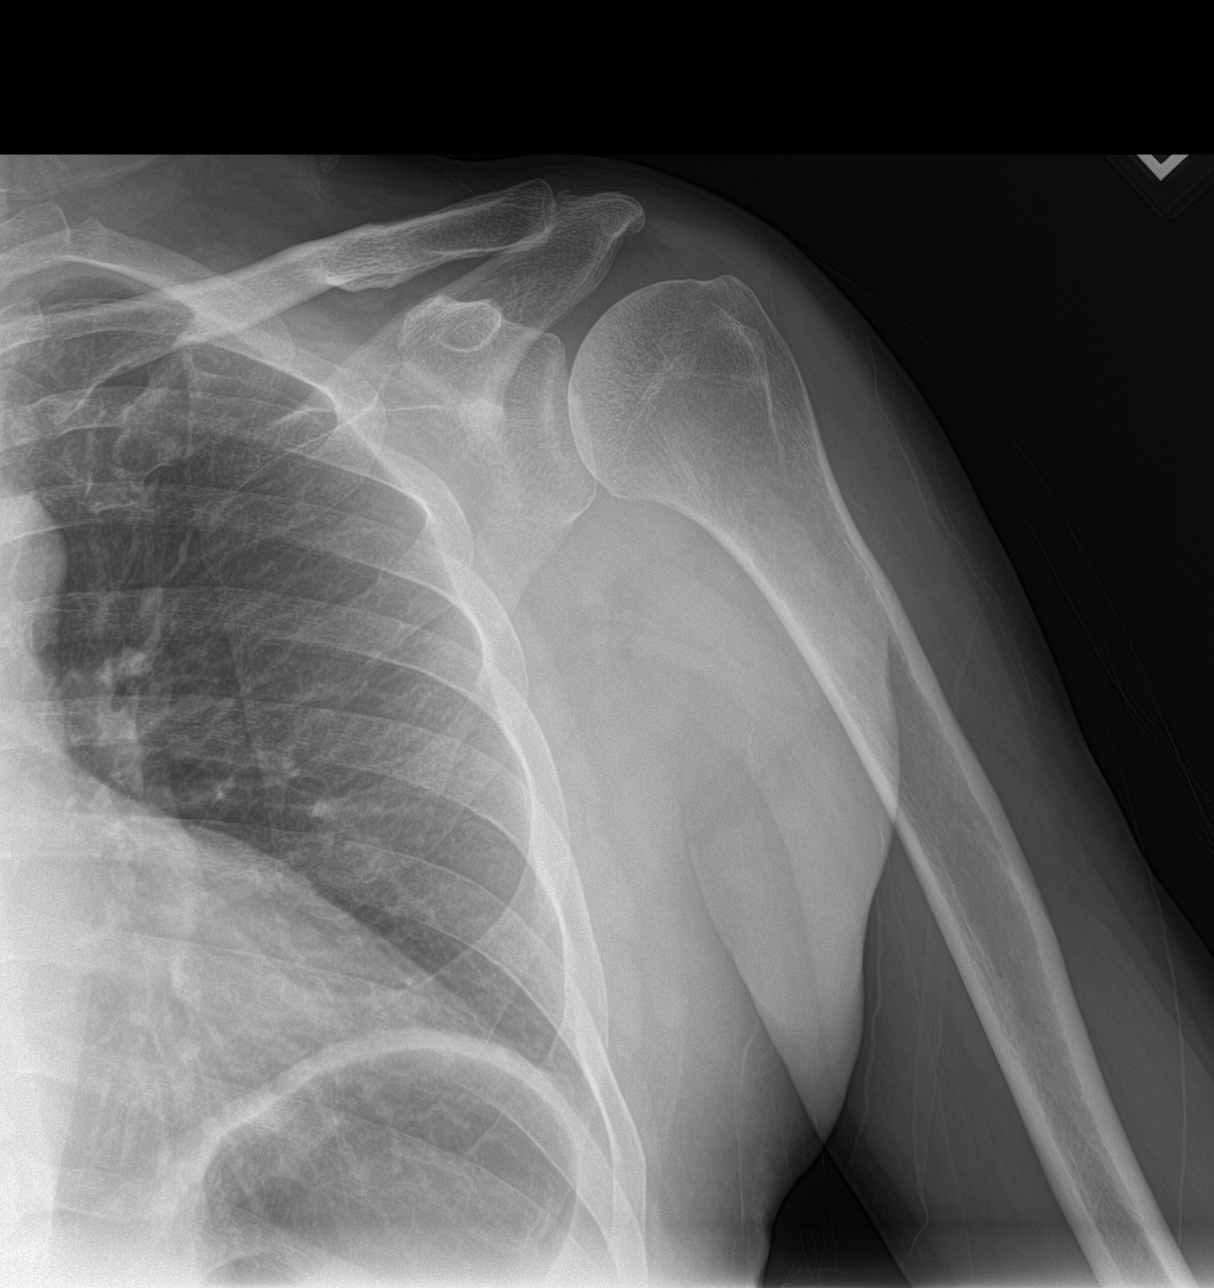

[shoulder y-view]
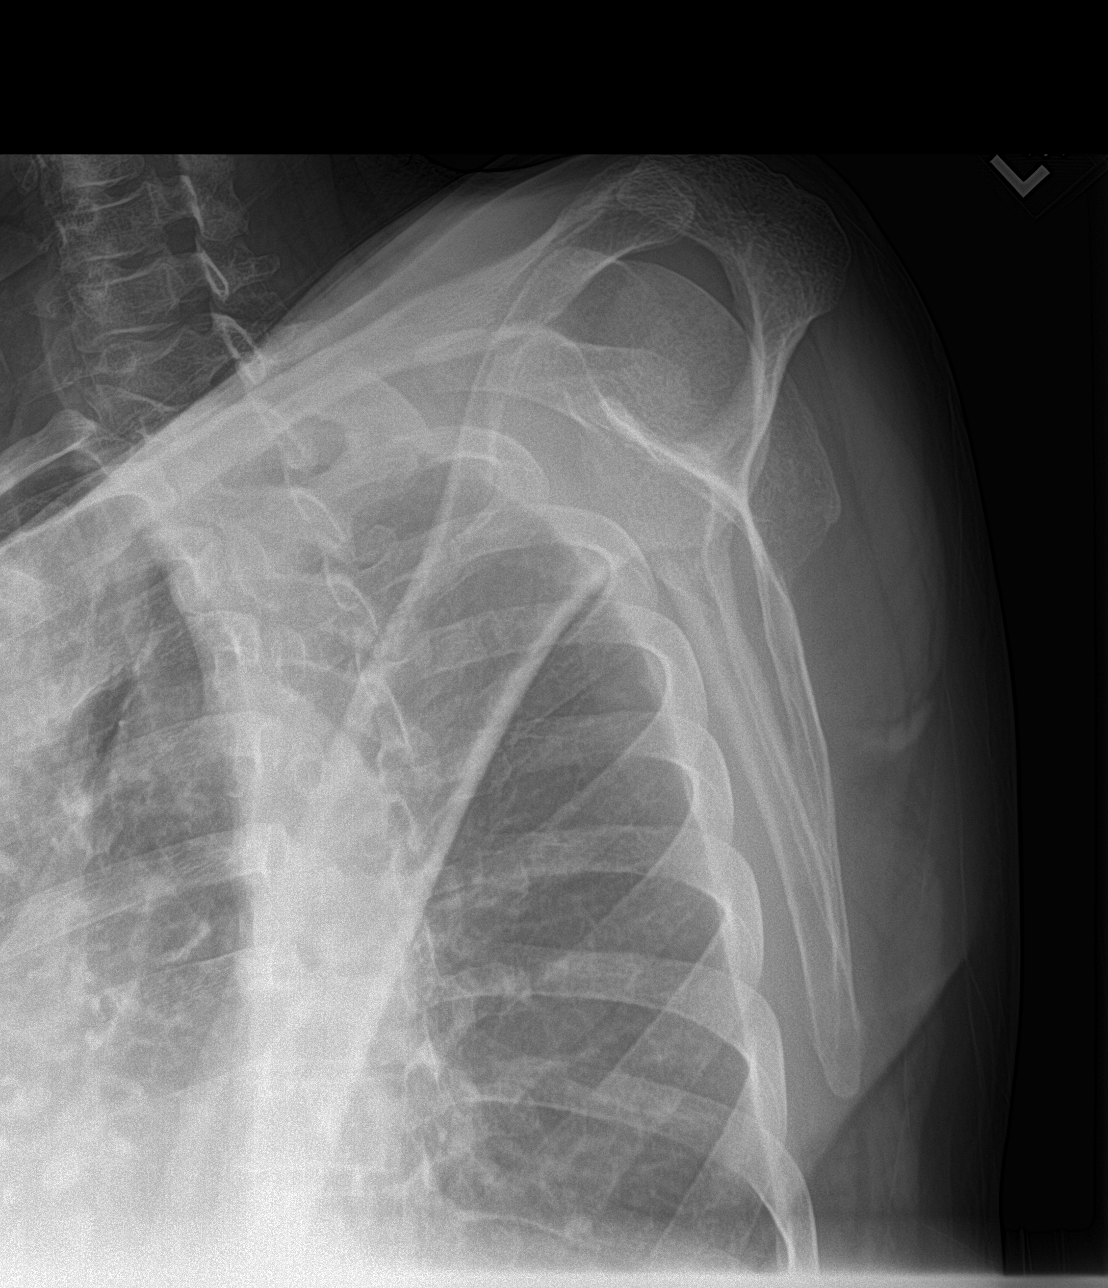

[shoulder axial]
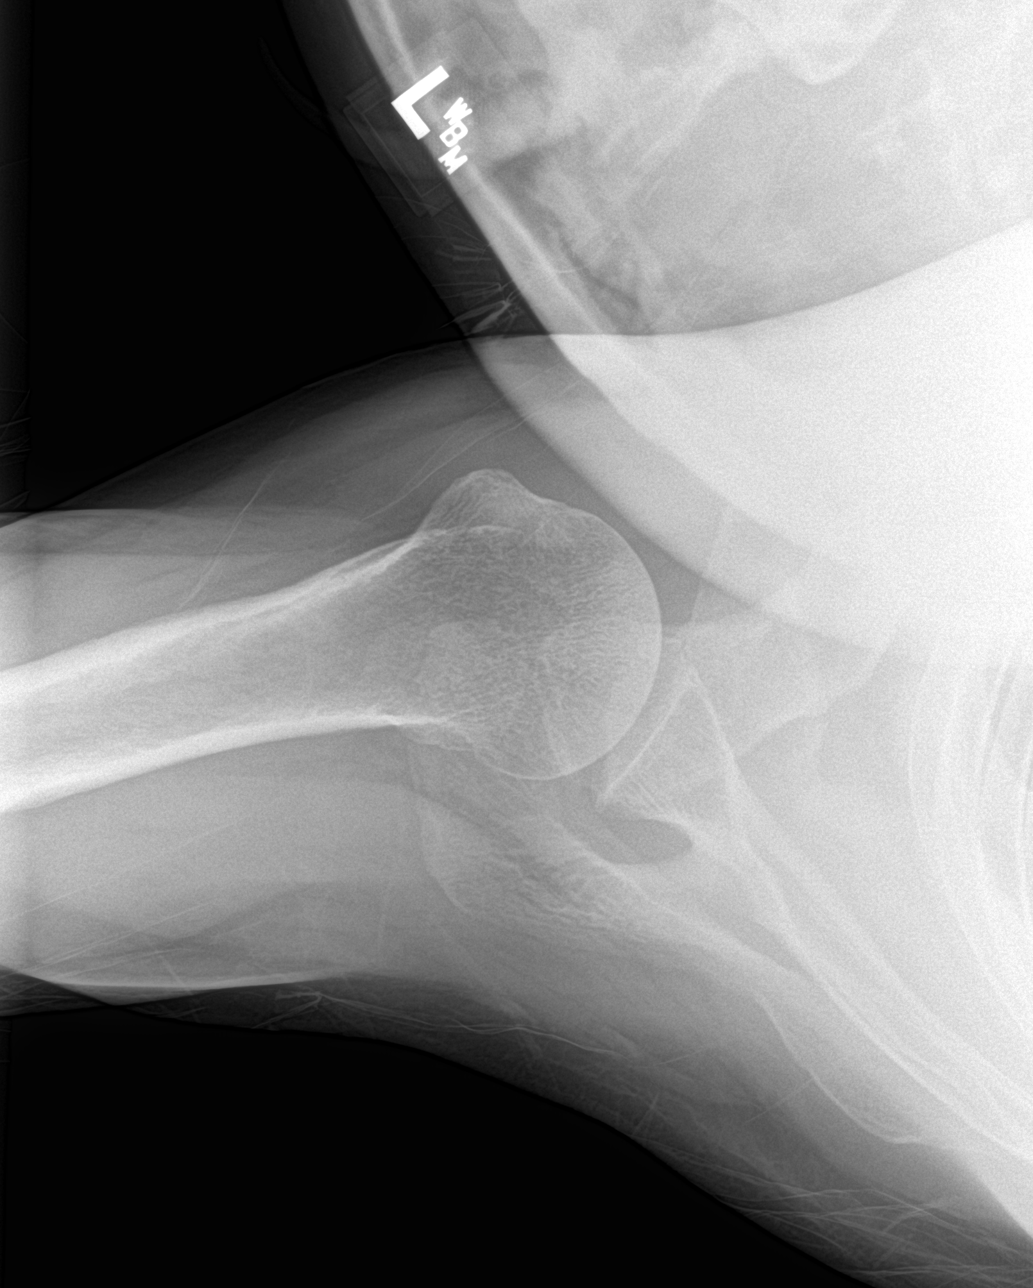

[3 of 3 positions shown; findings below may reference images not displayed]

FINDINGS: There is no evidence of fracture or dislocation. There is no
evidence of arthropathy or other focal bone abnormality. Soft
tissues are unremarkable.
IMPRESSION: Negative.

## 2023-09-02 ENCOUNTER — Encounter: Payer: Self-pay | Admitting: Dermatology

## 2023-09-02 ENCOUNTER — Ambulatory Visit (INDEPENDENT_AMBULATORY_CARE_PROVIDER_SITE_OTHER): Admitting: Dermatology

## 2023-09-02 DIAGNOSIS — L578 Other skin changes due to chronic exposure to nonionizing radiation: Secondary | ICD-10-CM

## 2023-09-02 DIAGNOSIS — Z7189 Other specified counseling: Secondary | ICD-10-CM

## 2023-09-02 DIAGNOSIS — L570 Actinic keratosis: Secondary | ICD-10-CM

## 2023-09-02 DIAGNOSIS — W908XXA Exposure to other nonionizing radiation, initial encounter: Secondary | ICD-10-CM

## 2023-09-02 DIAGNOSIS — L409 Psoriasis, unspecified: Secondary | ICD-10-CM

## 2023-09-02 DIAGNOSIS — Z79899 Other long term (current) drug therapy: Secondary | ICD-10-CM

## 2023-09-02 DIAGNOSIS — L821 Other seborrheic keratosis: Secondary | ICD-10-CM | POA: Diagnosis not present

## 2023-09-02 MED ORDER — MOMETASONE FUROATE 0.1 % EX SOLN: CUTANEOUS | 6 refills | Status: AC

## 2023-09-02 NOTE — Patient Instructions (Signed)

## 2023-09-02 NOTE — Progress Notes (Signed)
 Follow-Up Visit   Subjective  Bryan West is a 63 y.o. male who presents for the following: psoriasis follow up - doing well per brother, he would like refills of Mometasone  lotion and Ketoconazole  2% shampoo. He is also using Vaseline and baby oil on the buttock area. AK follow up, recheck sun exposed areas.  The patient has spots, moles and lesions to be evaluated, some may be new or changing.  The following portions of the chart were reviewed this encounter and updated as appropriate: medications, allergies, medical history  Review of Systems:  No other skin or systemic complaints except as noted in HPI or Assessment and Plan.  Objective  Well appearing patient in no apparent distress; mood and affect are within normal limits.  A focused examination was performed of the following areas: the face, scalp, arms, hands, and buttocks  Relevant exam findings are noted in the Assessment and Plan.  L index finger at the MCP x 1 Erythematous thin papules/macules with gritty scale.  ears, temples, neck, and hands x 18 (18) Erythematous thin papules/macules with gritty scale.   Assessment & Plan   HYPERTROPHIC ACTINIC KERATOSIS L index finger at the MCP x 1 Actinic keratoses are precancerous spots that appear secondary to cumulative UV radiation exposure/sun exposure over time. They are chronic with expected duration over 1 year. A portion of actinic keratoses will progress to squamous cell carcinoma of the skin. It is not possible to reliably predict which spots will progress to skin cancer and so treatment is recommended to prevent development of skin cancer.  Recommend daily broad spectrum sunscreen SPF 30+ to sun-exposed areas, reapply every 2 hours as needed.  Recommend staying in the shade or wearing long sleeves, sun glasses (UVA+UVB protection) and wide brim hats (4-inch brim around the entire circumference of the hat). Call for new or changing lesions.  Will recheck, and if  persistent recommend biopsy.Pt and brother who is present with him advised to return after 2 months if persists. Destruction of lesion - L index finger at the MCP x 1 Complexity: simple   Destruction method: cryotherapy   Informed consent: discussed and consent obtained   Timeout:  patient name, date of birth, surgical site, and procedure verified Lesion destroyed using liquid nitrogen: Yes   Region frozen until ice ball extended beyond lesion: Yes   Outcome: patient tolerated procedure well with no complications   Post-procedure details: wound care instructions given   AK (ACTINIC KERATOSIS) (18) ears, temples, neck, and hands x 18 (18) Actinic keratoses are precancerous spots that appear secondary to cumulative UV radiation exposure/sun exposure over time. They are chronic with expected duration over 1 year. A portion of actinic keratoses will progress to squamous cell carcinoma of the skin. It is not possible to reliably predict which spots will progress to skin cancer and so treatment is recommended to prevent development of skin cancer.  Recommend daily broad spectrum sunscreen SPF 30+ to sun-exposed areas, reapply every 2 hours as needed.  Recommend staying in the shade or wearing long sleeves, sun glasses (UVA+UVB protection) and wide brim hats (4-inch brim around the entire circumference of the hat). Call for new or changing lesions.  Destruction of lesion - ears, temples, neck, and hands x 18 (18) Complexity: simple   Destruction method: cryotherapy   Informed consent: discussed and consent obtained   Timeout:  patient name, date of birth, surgical site, and procedure verified Lesion destroyed using liquid nitrogen: Yes   Region  frozen until ice ball extended beyond lesion: Yes   Outcome: patient tolerated procedure well with no complications   Post-procedure details: wound care instructions given   PSORIASIS   Related Medications ketoconazole  (NIZORAL ) 2 % shampoo Apply 1  Application topically 3 (three) times a week. Wash scalp 3 times weekly, let sit 5 minutes before rinsing out mometasone  (ELOCON ) 0.1 % cream Apply ONCE A DAY topically up to 5 days a week to psoriasis on buttocks or body as needed for flares mometasone  (ELOCON ) 0.1 % lotion Daily up to 5 days per week prn flares ACTINIC SKIN DAMAGE   COUNSELING AND COORDINATION OF CARE   MEDICATION MANAGEMENT   SEBORRHEIC KERATOSIS    ACTINIC DAMAGE - chronic, secondary to cumulative UV radiation exposure/sun exposure over time - diffuse scaly erythematous macules with underlying dyspigmentation - Recommend daily broad spectrum sunscreen SPF 30+ to sun-exposed areas, reapply every 2 hours as needed.  - Recommend staying in the shade or wearing long sleeves, sun glasses (UVA+UVB protection) and wide brim hats (4-inch brim around the entire circumference of the hat). - Call for new or changing lesions.  SEBORRHEIC KERATOSIS - Stuck-on, waxy, tan-brown papules and/or plaques  - Benign-appearing - Discussed benign etiology and prognosis. - Observe - Call for any changes  PSORIASIS Buttocks, scalp Exam: mild hyperpigmentation at buttock  1 % BSA  Chronic and persistent condition with duration or expected duration over one year. Condition is improving with treatment but not currently at goal. Treatment Plan: Continue Mometasone  lotion to aa's QD-BID up to 5d/wk. Topical steroids (such as triamcinolone , fluocinolone, fluocinonide, mometasone , clobetasol, halobetasol, betamethasone, hydrocortisone) can cause thinning and lightening of the skin if they are used for too long in the same area. Your physician has selected the right strength medicine for your problem and area affected on the body. Please use your medication only as directed by your physician to prevent side effects.  Long term medication management.  Patient is using long term (months to years) prescription medication  to control their  dermatologic condition.  These medications require periodic monitoring to evaluate for efficacy and side effects and may require periodic laboratory monitoring.   Counseling on psoriasis and coordination of care  psoriasis is a chronic non-curable, but treatable genetic/hereditary disease that may have other systemic features affecting other organ systems such as joints (Psoriatic Arthritis). It is associated with an increased risk of inflammatory bowel disease, heart disease, non-alcoholic fatty liver disease, and depression.  Treatments include light and laser treatments; topical medications; and systemic medications including oral and injectables.  Return in about 6 months (around 03/04/2024) for AK and psoriasis follow up.  Arlinda Lais, CMA, am acting as scribe for Celine Collard, MD .   Documentation: I have reviewed the above documentation for accuracy and completeness, and I agree with the above.  Celine Collard, MD

## 2023-09-09 ENCOUNTER — Ambulatory Visit: Payer: Medicare Other | Admitting: Dermatology

## 2023-09-10 DIAGNOSIS — M79674 Pain in right toe(s): Secondary | ICD-10-CM | POA: Diagnosis not present

## 2023-09-10 DIAGNOSIS — B351 Tinea unguium: Secondary | ICD-10-CM | POA: Diagnosis not present

## 2023-09-10 DIAGNOSIS — M79675 Pain in left toe(s): Secondary | ICD-10-CM | POA: Diagnosis not present

## 2023-10-16 ENCOUNTER — Other Ambulatory Visit: Payer: Self-pay | Admitting: Family Medicine

## 2023-10-18 ENCOUNTER — Ambulatory Visit (INDEPENDENT_AMBULATORY_CARE_PROVIDER_SITE_OTHER): Admitting: Nurse Practitioner

## 2023-10-18 ENCOUNTER — Encounter: Payer: Self-pay | Admitting: Nurse Practitioner

## 2023-10-18 VITALS — BP 130/68 | HR 65 | Temp 98.0°F | Ht 67.8 in | Wt 183.6 lb

## 2023-10-18 DIAGNOSIS — N400 Enlarged prostate without lower urinary tract symptoms: Secondary | ICD-10-CM | POA: Diagnosis not present

## 2023-10-18 DIAGNOSIS — E782 Mixed hyperlipidemia: Secondary | ICD-10-CM

## 2023-10-18 DIAGNOSIS — I10 Essential (primary) hypertension: Secondary | ICD-10-CM | POA: Diagnosis not present

## 2023-10-18 DIAGNOSIS — R011 Cardiac murmur, unspecified: Secondary | ICD-10-CM | POA: Insufficient documentation

## 2023-10-18 MED ORDER — LOSARTAN POTASSIUM 50 MG PO TABS: 50.0000 mg | ORAL_TABLET | Freq: Every day | ORAL | 3 refills | Status: AC

## 2023-10-18 NOTE — Patient Instructions (Signed)

## 2023-10-18 NOTE — Assessment & Plan Note (Signed)
 Chronic, stable.  BP at goal in office on recheck today. Recommend she monitor BP at least a few mornings a week at home and document.  DASH diet at home.  Continue current medication regimen and adjust as needed.  Labs today: CBC, CMP, TSH.  Refills sent in.

## 2023-10-18 NOTE — Assessment & Plan Note (Signed)
 Chronic on labs noted.  May benefit statin in future based on ASCVD.  Recheck levels today and initiate medication as needed.

## 2023-10-18 NOTE — Assessment & Plan Note (Signed)
 Grade 2/6 systolic. Per family present for some time.  No current symptoms.  Continue to monitor and refer to cardiology as needed or obtain echo.

## 2023-10-18 NOTE — Progress Notes (Signed)
 BP 130/68 (BP Location: Left Arm, Patient Position: Sitting, Cuff Size: Normal)   Pulse 65   Temp 98 F (36.7 C) (Oral)   Ht 5' 7.8 (1.722 m)   Wt 183 lb 9.6 oz (83.3 kg)   SpO2 97%   BMI 28.08 kg/m    Subjective:    Patient ID: Bryan West, male    DOB: 01-Mar-1961, 63 y.o.   MRN: [id number]  HPI: Bryan West is a 63 y.o. male  Chief Complaint  Patient presents with   Hyperlipidemia   Hypertension   HYPERTENSION without Chronic Kidney Disease Follow-up for medication refills today.  Takes Losartan  daily. Hypertension status: stable  Satisfied with current treatment? yes Duration of hypertension: chronic BP monitoring frequency:  not checking BP range: not checking BP medication side effects:  no Medication compliance: good compliance Aspirin: no Recurrent headaches: no Visual changes: no Palpitations: no Dyspnea: no Chest pain: no Lower extremity edema: no Dizzy/lightheaded: no  The 10-year ASCVD risk score (Arnett DK, et al., 2019) is: 16.4%   Values used to calculate the score:     Age: 36 years     Clincally relevant sex: Male     Is Non-Hispanic African American: No     Diabetic: No     Tobacco smoker: No     Systolic Blood Pressure: 130 mmHg     Is BP treated: Yes     HDL Cholesterol: 37 mg/dL     Total Cholesterol: 194 mg/dL   Relevant past medical, surgical, family and social history reviewed and updated as indicated. Interim medical history since our last visit reviewed. Allergies and medications reviewed and updated.  Review of Systems  Constitutional:  Negative for activity change, diaphoresis, fatigue and fever.  Respiratory:  Negative for cough, chest tightness, shortness of breath and wheezing.   Cardiovascular:  Negative for chest pain, palpitations and leg swelling.  Gastrointestinal: Negative.   Endocrine: Negative.   Neurological: Negative.   Psychiatric/Behavioral: Negative.     Per HPI unless specifically indicated above      Objective:    BP 130/68 (BP Location: Left Arm, Patient Position: Sitting, Cuff Size: Normal)   Pulse 65   Temp 98 F (36.7 C) (Oral)   Ht 5' 7.8 (1.722 m)   Wt 183 lb 9.6 oz (83.3 kg)   SpO2 97%   BMI 28.08 kg/m   Wt Readings from Last 3 Encounters:  10/18/23 183 lb 9.6 oz (83.3 kg)  06/07/23 178 lb 9.6 oz (81 kg)  05/25/23 182 lb (82.6 kg)    Physical Exam Vitals and nursing note reviewed.  Constitutional:      General: He is awake. He is not in acute distress.    Appearance: He is well-developed and well-groomed. He is not ill-appearing or toxic-appearing.  HENT:     Head: Normocephalic.     Right Ear: Hearing and external ear normal.     Left Ear: Hearing and external ear normal.  Eyes:     General: Lids are normal.     Extraocular Movements: Extraocular movements intact.     Conjunctiva/sclera: Conjunctivae normal.  Neck:     Thyroid : No thyromegaly.     Vascular: No carotid bruit.  Cardiovascular:     Rate and Rhythm: Normal rate and regular rhythm.     Heart sounds: Murmur heard.     Systolic murmur is present with a grade of 2/6.     No gallop.  Pulmonary:  Effort: No accessory muscle usage or respiratory distress.     Breath sounds: Normal breath sounds.  Abdominal:     General: Bowel sounds are normal. There is no distension.     Palpations: Abdomen is soft.     Tenderness: There is no abdominal tenderness.  Musculoskeletal:     Cervical back: Full passive range of motion without pain.     Right lower leg: No edema.     Left lower leg: No edema.  Lymphadenopathy:     Cervical: No cervical adenopathy.  Skin:    General: Skin is warm.     Capillary Refill: Capillary refill takes less than 2 seconds.  Neurological:     Mental Status: He is alert and oriented to person, place, and time.     Deep Tendon Reflexes: Reflexes are normal and symmetric.     Reflex Scores:      Brachioradialis reflexes are 2+ on the right side and 2+ on the left side.       Patellar reflexes are 2+ on the right side and 2+ on the left side. Psychiatric:        Attention and Perception: Attention normal.        Mood and Affect: Mood normal.        Speech: Speech normal.        Behavior: Behavior normal. Behavior is cooperative.        Thought Content: Thought content normal.    Results for orders placed or performed in visit on 06/07/23  Rapid Strep Screen ([facility] ONLY)   Collection Time: 06/07/23  4:03 PM   Specimen: Other   Other  Result Value Ref Range   Strep Gp A Ag, IA W/Reflex Negative Negative  Culture, Group A Strep   Collection Time: 06/07/23  4:03 PM   Other  Result Value Ref Range   Strep A Culture Negative   Veritor Flu A/B Waived   Collection Time: 06/07/23  4:03 PM  Result Value Ref Range   Influenza A Negative Negative   Influenza B Negative Negative  Novel Coronavirus, NAA ([facility])   Collection Time: 06/07/23  4:08 PM   Specimen: Nasopharyngeal(NP) swabs in vial transport medium  Result Value Ref Range   SARS-CoV-2, NAA Not Detected Not Detected      Assessment & Plan:   Problem List Items Addressed This Visit       Cardiovascular and Mediastinum   Hypertension - Primary   Chronic, stable.  BP at goal in office on recheck today. Recommend she monitor BP at least a few mornings a week at home and document.  DASH diet at home.  Continue current medication regimen and adjust as needed.  Labs today: CBC, CMP, TSH.  Refills sent in.       Relevant Medications   losartan  (COZAAR ) 50 MG tablet   Other Relevant Orders   CBC with Differential/Platelet   Comprehensive metabolic panel with GFR   TSH     Other   Hyperlipidemia   Chronic on labs noted.  May benefit statin in future based on ASCVD.  Recheck levels today and initiate medication as needed.      Relevant Medications   losartan  (COZAAR ) 50 MG tablet   Other Relevant Orders   Comprehensive metabolic panel with GFR   Lipid Panel w/o Chol/HDL Ratio    Heart murmur   Grade 2/6 systolic. Per family present for some time.  No current symptoms.  Continue to monitor  and refer to cardiology as needed or obtain echo.      Other Visit Diagnoses       Benign prostatic hyperplasia without lower urinary tract symptoms       PSA on labs today.   Relevant Orders   PSA        Follow up plan: Return for as scheduled in February 2026.

## 2023-10-19 ENCOUNTER — Ambulatory Visit: Payer: Self-pay | Admitting: Nurse Practitioner

## 2023-10-19 LAB — COMPREHENSIVE METABOLIC PANEL WITH GFR
ALT: 22 IU/L (ref 0–44)
AST: 12 IU/L (ref 0–40)
Albumin: 4 g/dL (ref 3.9–4.9)
Alkaline Phosphatase: 108 IU/L (ref 44–121)
BUN/Creatinine Ratio: 11 (ref 10–24)
BUN: 10 mg/dL (ref 8–27)
Bilirubin Total: 0.7 mg/dL (ref 0.0–1.2)
CO2: 23 mmol/L (ref 20–29)
Calcium: 9.2 mg/dL (ref 8.6–10.2)
Chloride: 101 mmol/L (ref 96–106)
Creatinine, Ser: 0.89 mg/dL (ref 0.76–1.27)
Globulin, Total: 3 g/dL (ref 1.5–4.5)
Glucose: 97 mg/dL (ref 70–99)
Potassium: 4.6 mmol/L (ref 3.5–5.2)
Sodium: 138 mmol/L (ref 134–144)
Total Protein: 7 g/dL (ref 6.0–8.5)
eGFR: 96 mL/min/1.73

## 2023-10-19 LAB — LIPID PANEL W/O CHOL/HDL RATIO
Cholesterol, Total: 202 mg/dL — ABNORMAL HIGH (ref 100–199)
HDL: 45 mg/dL
LDL Chol Calc (NIH): 119 mg/dL — ABNORMAL HIGH (ref 0–99)
Triglycerides: 217 mg/dL — ABNORMAL HIGH (ref 0–149)
VLDL Cholesterol Cal: 38 mg/dL (ref 5–40)

## 2023-10-19 LAB — CBC WITH DIFFERENTIAL/PLATELET
Basophils Absolute: 0.1 x10E3/uL (ref 0.0–0.2)
Basos: 1 %
EOS (ABSOLUTE): 0.3 x10E3/uL (ref 0.0–0.4)
Eos: 4 %
Hematocrit: 43.2 % (ref 37.5–51.0)
Hemoglobin: 14.4 g/dL (ref 13.0–17.7)
Immature Grans (Abs): 0 x10E3/uL (ref 0.0–0.1)
Immature Granulocytes: 0 %
Lymphocytes Absolute: 2.4 x10E3/uL (ref 0.7–3.1)
Lymphs: 39 %
MCH: 31.5 pg (ref 26.6–33.0)
MCHC: 33.3 g/dL (ref 31.5–35.7)
MCV: 95 fL (ref 79–97)
Monocytes Absolute: 0.5 x10E3/uL (ref 0.1–0.9)
Monocytes: 9 %
Neutrophils Absolute: 2.9 x10E3/uL (ref 1.4–7.0)
Neutrophils: 47 %
Platelets: 152 x10E3/uL (ref 150–450)
RBC: 4.57 x10E6/uL (ref 4.14–5.80)
RDW: 13.2 % (ref 11.6–15.4)
WBC: 6.1 x10E3/uL (ref 3.4–10.8)

## 2023-10-19 LAB — TSH: TSH: 1.55 u[IU]/mL (ref 0.450–4.500)

## 2023-10-19 LAB — PSA: Prostate Specific Ag, Serum: 1.2 ng/mL (ref 0.0–4.0)

## 2023-10-19 NOTE — Progress Notes (Signed)
 Good afternoon,  please let Bryan West and family know his labs have returned and overall are stable with exception of lipid panel, this continues to have some elevation in LDL, bad cholesterol, and total cholesterol.  Have you ever taken medication, a statin, to lower levels? You may benefit from starting a low dose of Rosuvastatin if not.  This can help lower levels and prevent stroke.  Main side effects are muscle aches all over or extreme fatigue, if this happens we either adjust dosing or try something different.  Would you like to try this? If so I will send in and would recommend recheck of labs at next visit.  Any questions? Keep being stellar!!  Thank you for allowing me to participate in your care.  I appreciate you. Kindest regards, Sunita Demond

## 2023-10-19 NOTE — Telephone Encounter (Signed)
 Requested by interface surescripts. Receipt confirmed by pharmacy 10/18/23 at 1:31 pm. Duplicate request.  Requested Prescriptions  Refused Prescriptions Disp Refills   losartan  (COZAAR ) 50 MG tablet [Pharmacy Med Name: LOSARTAN  POTASSIUM 50 MG TAB] 90 tablet 0    Sig: Take 1 tablet (50 mg total) by mouth daily.     Cardiovascular:  Angiotensin Receptor Blockers Passed - 10/19/2023 12:39 PM      Passed - Cr in normal range and within 180 days    Creatinine, Ser  Date Value Ref Range Status  10/18/2023 0.89 0.76 - 1.27 mg/dL Final         Passed - K in normal range and within 180 days    Potassium  Date Value Ref Range Status  10/18/2023 4.6 3.5 - 5.2 mmol/L Final         Passed - Patient is not pregnant      Passed - Last BP in normal range    BP Readings from Last 1 Encounters:  10/18/23 130/68         Passed - Valid encounter within last 6 months    Recent Outpatient Visits           Yesterday Primary hypertension   [place] [facility] [place], Melanie T, NP   4 months ago Upper respiratory tract infection, unspecified type   [place] [facility] Vicci Duwaine SQUIBB, DO       Future Appointments             In 4 months Hester Alm BROCKS, MD [facility]

## 2024-02-22 ENCOUNTER — Encounter: Payer: Self-pay | Admitting: Dermatology

## 2024-02-22 ENCOUNTER — Ambulatory Visit: Admitting: Dermatology

## 2024-02-22 DIAGNOSIS — L570 Actinic keratosis: Secondary | ICD-10-CM

## 2024-02-22 DIAGNOSIS — L578 Other skin changes due to chronic exposure to nonionizing radiation: Secondary | ICD-10-CM

## 2024-02-22 DIAGNOSIS — L409 Psoriasis, unspecified: Secondary | ICD-10-CM | POA: Diagnosis not present

## 2024-02-22 DIAGNOSIS — Z79899 Other long term (current) drug therapy: Secondary | ICD-10-CM

## 2024-02-22 DIAGNOSIS — Z7189 Other specified counseling: Secondary | ICD-10-CM

## 2024-02-22 DIAGNOSIS — W908XXA Exposure to other nonionizing radiation, initial encounter: Secondary | ICD-10-CM

## 2024-02-22 NOTE — Patient Instructions (Addendum)

## 2024-02-22 NOTE — Progress Notes (Unsigned)
 Follow-Up Visit   Subjective  Bryan West is a 63 y.o. male who presents for the following: AK 46m f/u, Psoriasis 99m f/u buttocks, scalp, Mometasone  lotion prn up to 5d/wk, Hypertropic AK L index finger at MCP  Patient accompanied by brother who contributes to history.  The following portions of the chart were reviewed this encounter and updated as appropriate: medications, allergies, medical history  Review of Systems:  No other skin or systemic complaints except as noted in HPI or Assessment and Plan.  Objective  Well appearing patient in no apparent distress; mood and affect are within normal limits.  A focused examination was performed of the following areas: Face, hands, buttocks  Relevant exam findings are noted in the Assessment and Plan.  hands x 20, ears, face x 7 (27) Pink scaly macules  Assessment & Plan   PSORIASIS Buttocks, scalp Exam: scalp clear today 1% BSA. Chronic and persistent condition with duration or expected duration over one year. Condition is improving with treatment but not currently at goal. Psoriasis is a chronic non-curable, but treatable genetic/hereditary disease that may have other systemic features affecting other organ systems such as joints (Psoriatic Arthritis). It is associated with an increased risk of inflammatory bowel disease, heart disease, non-alcoholic fatty liver disease, and depression.  Treatments include light and laser treatments; topical medications; and systemic medications including oral and injectables. Treatment Plan: Cont Mometasone  qd/bid up to 5d/wk aa buttocks, scalp prn flares, avoid f/g/a Start Zoryve  0.3% cr qd aa psoriasis, samples x 6 given Lot [id number], exp 02/10/26  Topical steroids (such as triamcinolone , fluocinolone, fluocinonide, mometasone , clobetasol, halobetasol, betamethasone, hydrocortisone) can cause thinning and lightening of the skin if they are used for too long in the same area. Your physician has  selected the right strength medicine for your problem and area affected on the body. Please use your medication only as directed by your physician to prevent side effects.  Long term medication management.  Patient is using long term (months to years) prescription medication  to control their dermatologic condition.  These medications require periodic monitoring to evaluate for efficacy and side effects and may require periodic laboratory monitoring.  AK (ACTINIC KERATOSIS) (27) hands x 20, ears, face x 7 (27) Actinic keratoses are precancerous spots that appear secondary to cumulative UV radiation exposure/sun exposure over time. They are chronic with expected duration over 1 year. A portion of actinic keratoses will progress to squamous cell carcinoma of the skin. It is not possible to reliably predict which spots will progress to skin cancer and so treatment is recommended to prevent development of skin cancer.  Recommend daily broad spectrum sunscreen SPF 30+ to sun-exposed areas, reapply every 2 hours as needed.  Recommend staying in the shade or wearing long sleeves, sun glasses (UVA+UVB protection) and wide brim hats (4-inch brim around the entire circumference of the hat). Call for new or changing lesions. Destruction of lesion - hands x 20, ears, face x 7 (27) Complexity: simple   Destruction method: cryotherapy   Informed consent: discussed and consent obtained   Timeout:  patient name, date of birth, surgical site, and procedure verified Lesion destroyed using liquid nitrogen: Yes   Region frozen until ice ball extended beyond lesion: Yes   Outcome: patient tolerated procedure well with no complications   Post-procedure details: wound care instructions given     ACTINIC DAMAGE - chronic, secondary to cumulative UV radiation exposure/sun exposure over time - diffuse scaly erythematous macules with underlying  dyspigmentation - Recommend daily broad spectrum sunscreen SPF 30+ to  sun-exposed areas, reapply every 2 hours as needed.  - Recommend staying in the shade or wearing long sleeves, sun glasses (UVA+UVB protection) and wide brim hats (4-inch brim around the entire circumference of the hat). - Call for new or changing lesions.   Return in about 6 months (around 08/21/2024) for AK f/u, Psoriasis f/u.  I, Grayce Saunas, RMA, am acting as scribe for Alm Rhyme, MD .   Documentation: I have reviewed the above documentation for accuracy and completeness, and I agree with the above.  Alm Rhyme, MD

## 2024-02-23 ENCOUNTER — Encounter: Payer: Self-pay | Admitting: Dermatology

## 2024-03-15 DIAGNOSIS — M79674 Pain in right toe(s): Secondary | ICD-10-CM | POA: Diagnosis not present

## 2024-03-15 DIAGNOSIS — B351 Tinea unguium: Secondary | ICD-10-CM | POA: Diagnosis not present

## 2024-03-15 DIAGNOSIS — M79675 Pain in left toe(s): Secondary | ICD-10-CM | POA: Diagnosis not present

## 2024-03-17 ENCOUNTER — Ambulatory Visit (INDEPENDENT_AMBULATORY_CARE_PROVIDER_SITE_OTHER)

## 2024-03-17 DIAGNOSIS — Z23 Encounter for immunization: Secondary | ICD-10-CM

## 2024-03-17 NOTE — Progress Notes (Addendum)
 Patient is in office today for a nurse visit for Flu Vaccine. Patient Injection was given in the  Left deltoid. Patient tolerated injection well.

## 2024-08-22 ENCOUNTER — Ambulatory Visit: Admitting: Dermatology
